# Patient Record
Sex: Female | Born: 1980 | Race: Black or African American | Hispanic: No | Marital: Single | Smoking: Former smoker
Health system: Southern US, Community
[De-identification: ages and names within clinical notes are randomized; demographics above are authoritative.]

## PROBLEM LIST (undated history)

## (undated) DIAGNOSIS — F32A Depression, unspecified: Secondary | ICD-10-CM

## (undated) DIAGNOSIS — Z8489 Family history of other specified conditions: Secondary | ICD-10-CM

## (undated) DIAGNOSIS — J45909 Unspecified asthma, uncomplicated: Secondary | ICD-10-CM

## (undated) DIAGNOSIS — R102 Pelvic and perineal pain: Secondary | ICD-10-CM

## (undated) DIAGNOSIS — R519 Headache, unspecified: Secondary | ICD-10-CM

## (undated) DIAGNOSIS — K219 Gastro-esophageal reflux disease without esophagitis: Secondary | ICD-10-CM

## (undated) DIAGNOSIS — F419 Anxiety disorder, unspecified: Secondary | ICD-10-CM

## (undated) DIAGNOSIS — F4310 Post-traumatic stress disorder, unspecified: Secondary | ICD-10-CM

## (undated) DIAGNOSIS — F410 Panic disorder [episodic paroxysmal anxiety] without agoraphobia: Secondary | ICD-10-CM

## (undated) HISTORY — PX: MYRINGOTOMY: SUR874

## (undated) HISTORY — PX: DILATION AND CURETTAGE OF UTERUS: SHX78

## (undated) HISTORY — PX: TONSILLECTOMY: SUR1361

## (undated) HISTORY — PX: ADENOIDECTOMY: SUR15

## (undated) HISTORY — PX: OTHER SURGICAL HISTORY: SHX169

---
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
## 2000-09-12 ENCOUNTER — Emergency Department (HOSPITAL_COMMUNITY): Admission: EM | Admit: 2000-09-12 | Discharge: 2000-09-12 | Payer: Self-pay | Admitting: Emergency Medicine

## 2001-03-05 ENCOUNTER — Emergency Department (HOSPITAL_COMMUNITY): Admission: EM | Admit: 2001-03-05 | Discharge: 2001-03-05 | Payer: Self-pay | Admitting: Emergency Medicine

## 2001-05-08 ENCOUNTER — Inpatient Hospital Stay (HOSPITAL_COMMUNITY): Admission: AD | Admit: 2001-05-08 | Discharge: 2001-05-08 | Payer: Self-pay | Admitting: Obstetrics

## 2001-06-07 ENCOUNTER — Other Ambulatory Visit: Admission: RE | Admit: 2001-06-07 | Discharge: 2001-06-07 | Payer: Self-pay | Admitting: *Deleted

## 2001-07-27 ENCOUNTER — Inpatient Hospital Stay (HOSPITAL_COMMUNITY): Admission: AD | Admit: 2001-07-27 | Discharge: 2001-07-27 | Payer: Self-pay | Admitting: *Deleted

## 2001-08-10 ENCOUNTER — Encounter: Payer: Self-pay | Admitting: *Deleted

## 2001-08-10 ENCOUNTER — Ambulatory Visit (HOSPITAL_COMMUNITY): Admission: RE | Admit: 2001-08-10 | Discharge: 2001-08-10 | Payer: Self-pay | Admitting: *Deleted

## 2001-09-14 ENCOUNTER — Encounter: Payer: Self-pay | Admitting: *Deleted

## 2001-09-14 ENCOUNTER — Ambulatory Visit (HOSPITAL_COMMUNITY): Admission: RE | Admit: 2001-09-14 | Discharge: 2001-09-14 | Payer: Self-pay | Admitting: *Deleted

## 2001-12-02 ENCOUNTER — Inpatient Hospital Stay (HOSPITAL_COMMUNITY): Admission: AD | Admit: 2001-12-02 | Discharge: 2001-12-02 | Payer: Self-pay | Admitting: *Deleted

## 2001-12-05 ENCOUNTER — Inpatient Hospital Stay (HOSPITAL_COMMUNITY): Admission: AD | Admit: 2001-12-05 | Discharge: 2001-12-05 | Payer: Self-pay | Admitting: *Deleted

## 2001-12-17 ENCOUNTER — Inpatient Hospital Stay (HOSPITAL_COMMUNITY): Admission: AD | Admit: 2001-12-17 | Discharge: 2001-12-17 | Payer: Self-pay | Admitting: *Deleted

## 2001-12-18 ENCOUNTER — Inpatient Hospital Stay (HOSPITAL_COMMUNITY): Admission: AD | Admit: 2001-12-18 | Discharge: 2001-12-20 | Payer: Self-pay | Admitting: *Deleted

## 2002-02-21 ENCOUNTER — Emergency Department (HOSPITAL_COMMUNITY): Admission: EM | Admit: 2002-02-21 | Discharge: 2002-02-21 | Payer: Self-pay | Admitting: Emergency Medicine

## 2002-02-21 ENCOUNTER — Encounter: Payer: Self-pay | Admitting: Emergency Medicine

## 2002-06-22 ENCOUNTER — Emergency Department (HOSPITAL_COMMUNITY): Admission: EM | Admit: 2002-06-22 | Discharge: 2002-06-22 | Payer: Self-pay | Admitting: Emergency Medicine

## 2002-06-26 ENCOUNTER — Emergency Department (HOSPITAL_COMMUNITY): Admission: EM | Admit: 2002-06-26 | Discharge: 2002-06-26 | Payer: Self-pay | Admitting: Emergency Medicine

## 2002-10-30 ENCOUNTER — Encounter: Admission: RE | Admit: 2002-10-30 | Discharge: 2002-10-30 | Payer: Self-pay | Admitting: Sports Medicine

## 2002-11-05 ENCOUNTER — Encounter: Admission: RE | Admit: 2002-11-05 | Discharge: 2002-11-05 | Payer: Self-pay | Admitting: Sports Medicine

## 2002-11-15 ENCOUNTER — Encounter: Admission: RE | Admit: 2002-11-15 | Discharge: 2002-11-15 | Payer: Self-pay | Admitting: Sports Medicine

## 2002-12-13 ENCOUNTER — Encounter: Admission: RE | Admit: 2002-12-13 | Discharge: 2002-12-13 | Payer: Self-pay | Admitting: Sports Medicine

## 2003-01-07 ENCOUNTER — Encounter: Admission: RE | Admit: 2003-01-07 | Discharge: 2003-01-07 | Payer: Self-pay | Admitting: Family Medicine

## 2003-10-20 ENCOUNTER — Emergency Department (HOSPITAL_COMMUNITY): Admission: EM | Admit: 2003-10-20 | Discharge: 2003-10-20 | Payer: Self-pay | Admitting: Emergency Medicine

## 2003-10-31 ENCOUNTER — Emergency Department (HOSPITAL_COMMUNITY): Admission: EM | Admit: 2003-10-31 | Discharge: 2003-10-31 | Payer: Self-pay | Admitting: *Deleted

## 2003-12-22 ENCOUNTER — Emergency Department (HOSPITAL_COMMUNITY): Admission: EM | Admit: 2003-12-22 | Discharge: 2003-12-23 | Payer: Self-pay | Admitting: Emergency Medicine

## 2004-05-12 ENCOUNTER — Ambulatory Visit: Payer: Self-pay | Admitting: Family Medicine

## 2004-05-21 ENCOUNTER — Ambulatory Visit: Payer: Self-pay | Admitting: Family Medicine

## 2004-06-10 ENCOUNTER — Ambulatory Visit: Payer: Self-pay | Admitting: Sports Medicine

## 2004-06-25 ENCOUNTER — Encounter (INDEPENDENT_AMBULATORY_CARE_PROVIDER_SITE_OTHER): Payer: Self-pay | Admitting: *Deleted

## 2004-06-25 LAB — CONVERTED CEMR LAB

## 2004-07-15 ENCOUNTER — Ambulatory Visit: Payer: Self-pay | Admitting: Sports Medicine

## 2004-09-13 ENCOUNTER — Emergency Department (HOSPITAL_COMMUNITY): Admission: EM | Admit: 2004-09-13 | Discharge: 2004-09-14 | Payer: Self-pay | Admitting: Emergency Medicine

## 2004-10-13 ENCOUNTER — Ambulatory Visit: Payer: Self-pay | Admitting: Sports Medicine

## 2004-11-11 ENCOUNTER — Ambulatory Visit: Payer: Self-pay | Admitting: Sports Medicine

## 2004-11-26 ENCOUNTER — Ambulatory Visit: Payer: Self-pay | Admitting: Family Medicine

## 2004-12-25 ENCOUNTER — Emergency Department (HOSPITAL_COMMUNITY): Admission: EM | Admit: 2004-12-25 | Discharge: 2004-12-25 | Payer: Self-pay | Admitting: Emergency Medicine

## 2004-12-28 ENCOUNTER — Ambulatory Visit: Payer: Self-pay | Admitting: Family Medicine

## 2005-01-18 ENCOUNTER — Encounter: Admission: RE | Admit: 2005-01-18 | Discharge: 2005-01-18 | Payer: Self-pay | Admitting: Otolaryngology

## 2005-01-19 ENCOUNTER — Ambulatory Visit: Payer: Self-pay | Admitting: Sports Medicine

## 2005-01-22 ENCOUNTER — Ambulatory Visit: Payer: Self-pay | Admitting: Sports Medicine

## 2005-01-27 ENCOUNTER — Ambulatory Visit: Payer: Self-pay | Admitting: Sports Medicine

## 2005-01-27 ENCOUNTER — Ambulatory Visit (HOSPITAL_COMMUNITY): Admission: RE | Admit: 2005-01-27 | Discharge: 2005-01-27 | Payer: Self-pay | Admitting: Sports Medicine

## 2005-02-03 ENCOUNTER — Other Ambulatory Visit: Admission: RE | Admit: 2005-02-03 | Discharge: 2005-02-03 | Payer: Self-pay | Admitting: Otolaryngology

## 2005-02-09 ENCOUNTER — Emergency Department (HOSPITAL_COMMUNITY): Admission: EM | Admit: 2005-02-09 | Discharge: 2005-02-10 | Payer: Self-pay | Admitting: Emergency Medicine

## 2005-02-18 ENCOUNTER — Ambulatory Visit: Payer: Self-pay | Admitting: Family Medicine

## 2005-02-23 ENCOUNTER — Ambulatory Visit: Payer: Self-pay | Admitting: Sports Medicine

## 2005-02-24 ENCOUNTER — Ambulatory Visit (HOSPITAL_COMMUNITY): Admission: RE | Admit: 2005-02-24 | Discharge: 2005-02-24 | Payer: Self-pay | Admitting: Sports Medicine

## 2005-03-18 ENCOUNTER — Ambulatory Visit: Payer: Self-pay | Admitting: *Deleted

## 2005-03-26 ENCOUNTER — Ambulatory Visit (HOSPITAL_COMMUNITY): Admission: RE | Admit: 2005-03-26 | Discharge: 2005-03-26 | Payer: Self-pay | Admitting: Otolaryngology

## 2005-03-26 ENCOUNTER — Encounter (INDEPENDENT_AMBULATORY_CARE_PROVIDER_SITE_OTHER): Payer: Self-pay | Admitting: *Deleted

## 2005-03-26 ENCOUNTER — Ambulatory Visit (HOSPITAL_BASED_OUTPATIENT_CLINIC_OR_DEPARTMENT_OTHER): Admission: RE | Admit: 2005-03-26 | Discharge: 2005-03-26 | Payer: Self-pay | Admitting: Otolaryngology

## 2005-03-30 ENCOUNTER — Ambulatory Visit: Payer: Self-pay | Admitting: Sports Medicine

## 2005-04-06 ENCOUNTER — Inpatient Hospital Stay (HOSPITAL_COMMUNITY): Admission: AD | Admit: 2005-04-06 | Discharge: 2005-04-06 | Payer: Self-pay | Admitting: *Deleted

## 2005-04-13 ENCOUNTER — Ambulatory Visit: Payer: Self-pay | Admitting: Sports Medicine

## 2005-04-23 ENCOUNTER — Ambulatory Visit: Payer: Self-pay | Admitting: Family Medicine

## 2005-04-28 ENCOUNTER — Emergency Department (HOSPITAL_COMMUNITY): Admission: EM | Admit: 2005-04-28 | Discharge: 2005-04-28 | Payer: Self-pay | Admitting: Family Medicine

## 2005-04-28 ENCOUNTER — Encounter: Admission: RE | Admit: 2005-04-28 | Discharge: 2005-04-28 | Payer: Self-pay | Admitting: Sports Medicine

## 2005-05-04 ENCOUNTER — Ambulatory Visit: Payer: Self-pay | Admitting: Sports Medicine

## 2005-05-20 ENCOUNTER — Ambulatory Visit: Payer: Self-pay | Admitting: Obstetrics and Gynecology

## 2005-05-26 ENCOUNTER — Encounter: Admission: RE | Admit: 2005-05-26 | Discharge: 2005-08-24 | Payer: Self-pay | Admitting: Orthopaedic Surgery

## 2005-06-16 ENCOUNTER — Emergency Department (HOSPITAL_COMMUNITY): Admission: EM | Admit: 2005-06-16 | Discharge: 2005-06-16 | Payer: Self-pay | Admitting: Family Medicine

## 2005-06-20 ENCOUNTER — Emergency Department (HOSPITAL_COMMUNITY): Admission: EM | Admit: 2005-06-20 | Discharge: 2005-06-20 | Payer: Self-pay | Admitting: Family Medicine

## 2005-07-01 ENCOUNTER — Ambulatory Visit: Payer: Self-pay | Admitting: Obstetrics and Gynecology

## 2005-07-02 ENCOUNTER — Ambulatory Visit (HOSPITAL_COMMUNITY): Admission: RE | Admit: 2005-07-02 | Discharge: 2005-07-02 | Payer: Self-pay | Admitting: Gastroenterology

## 2005-07-06 ENCOUNTER — Ambulatory Visit: Payer: Self-pay | Admitting: Family Medicine

## 2005-08-10 ENCOUNTER — Ambulatory Visit (HOSPITAL_COMMUNITY): Admission: RE | Admit: 2005-08-10 | Discharge: 2005-08-10 | Payer: Self-pay | Admitting: Gastroenterology

## 2005-11-07 IMAGING — CT CT ABDOMEN W/ CM
1 of 2 series · 15 of 32 positions shown, 19 images · IV contrast (&)
Comparison: none

03/03/05 ? DUPLICATE COPY for exam association in [facility] ? No change from original report.
CLINICAL DATA: Pelvic pain. 
 CT ABDOMEN WITH CONTRAST:
TECHNIQUE: Multidetector CT imaging of the abdomen was performed during bolus administration of intravenous contrast.  Oral contrast was given.  Delayed imaging through the kidneys was performed.
 Contrast:  100 cc Omnipaque 300.
TECHNIQUE: Multidetector CT imaging of the pelvis was performed during bolus administration of intravenous contrast.

[Series 2: routine abdomen · axial · 0.78mm/px · z∈[-447,-57]mm · 15 of 86 slices shown, 19 images]
[im 4/86  soft-tissue]
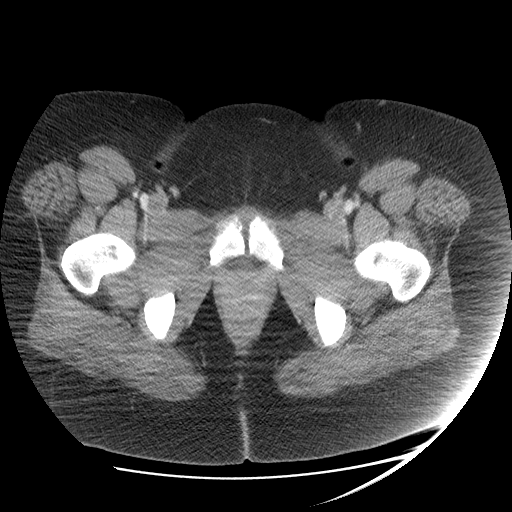
[im 4/86  bone]
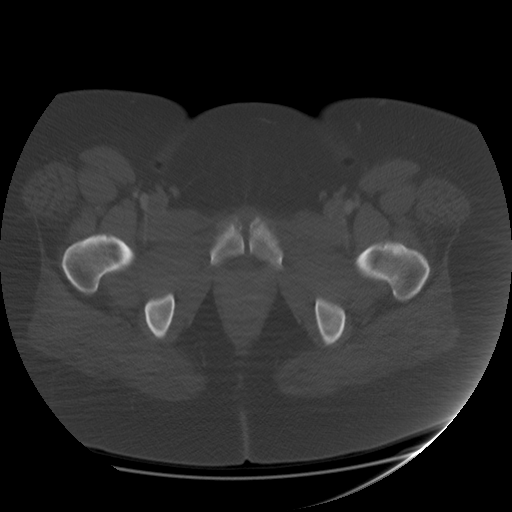
[im 12/86  soft-tissue]
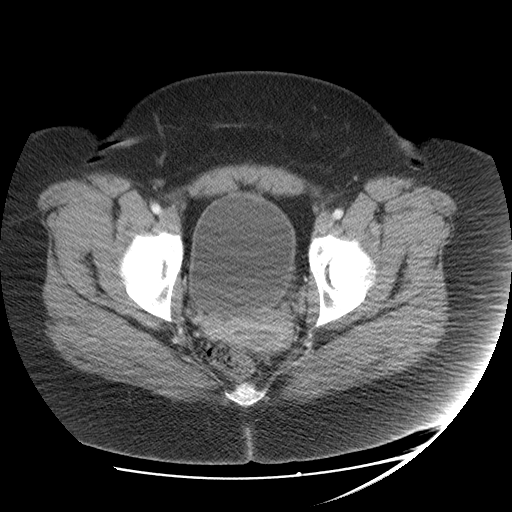
[im 19/86  soft-tissue]
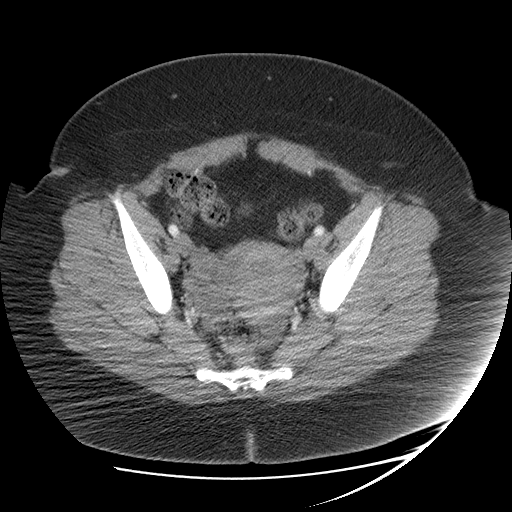
[im 23/86  soft-tissue]
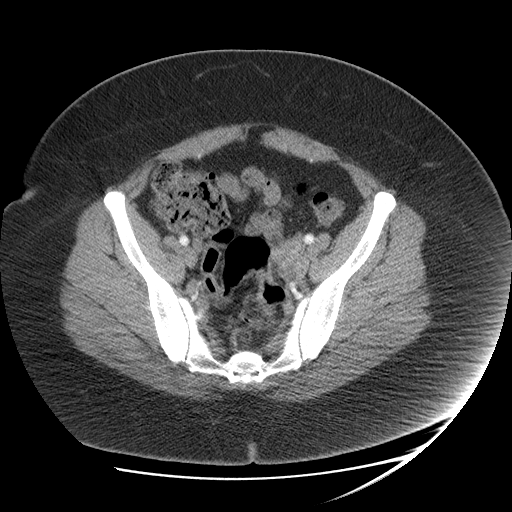
[im 30/86  soft-tissue]
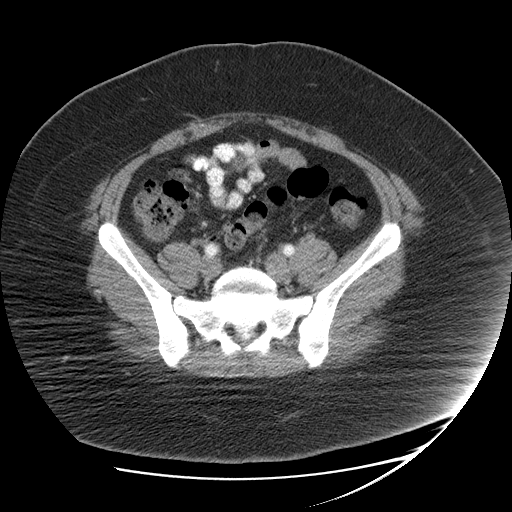
[im 37/86  soft-tissue]
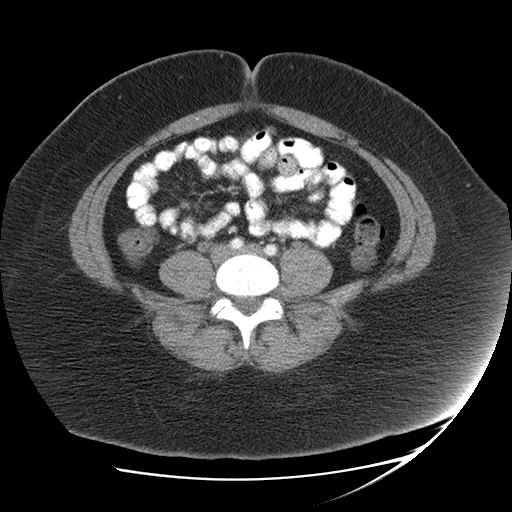
[im 45/86  soft-tissue]
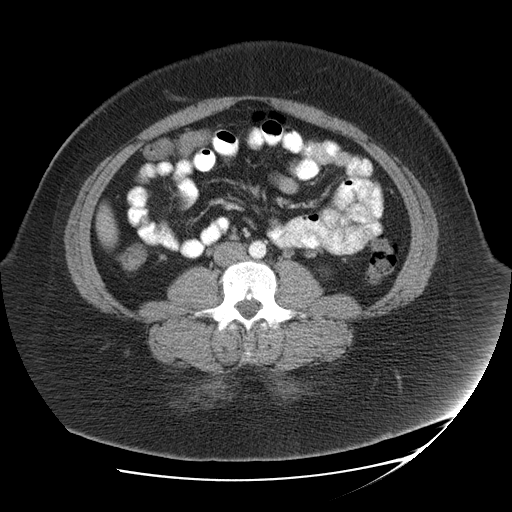
[im 49/86  soft-tissue]
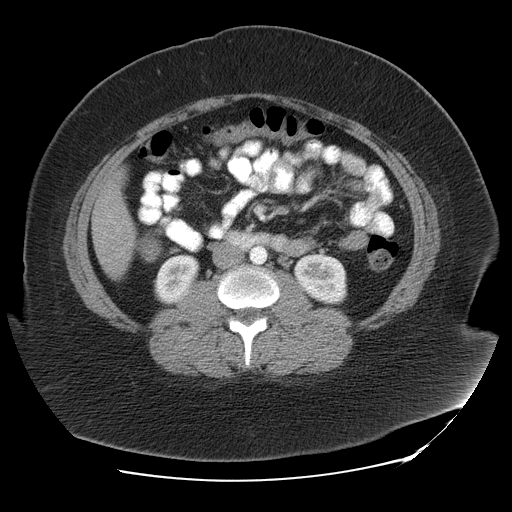
[im 56/86  soft-tissue]
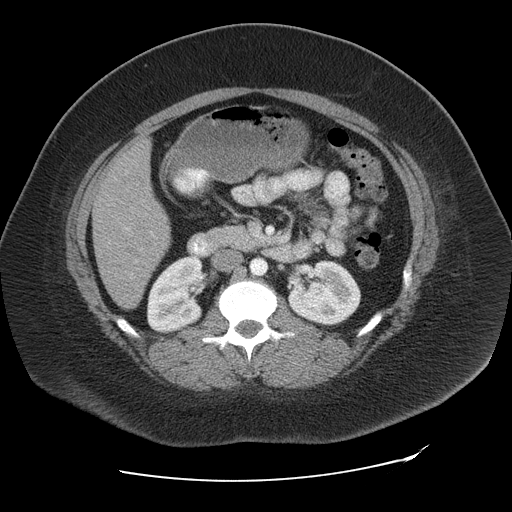
[im 56/86  bone]
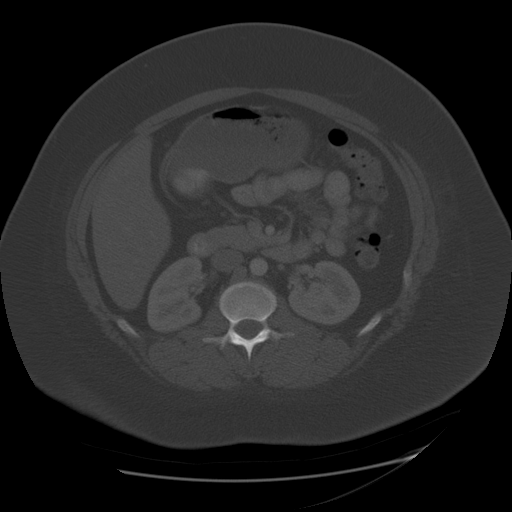
[im 63/86  soft-tissue]
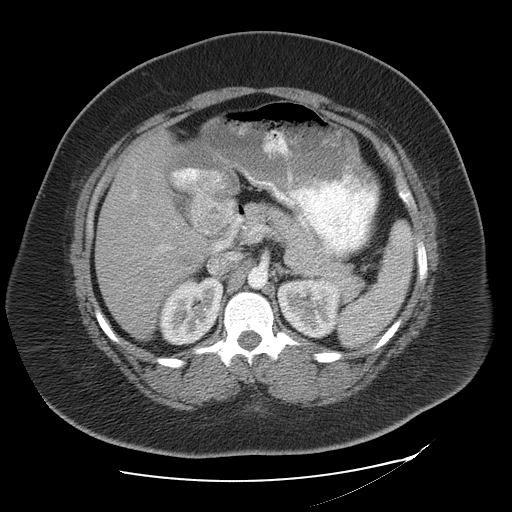
[im 67/86  soft-tissue]
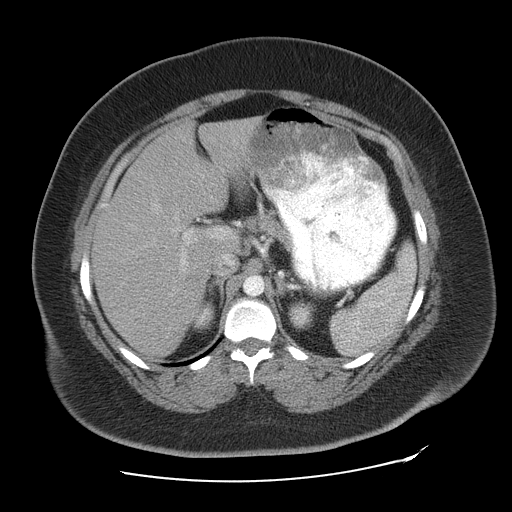
[im 71/86  lung]
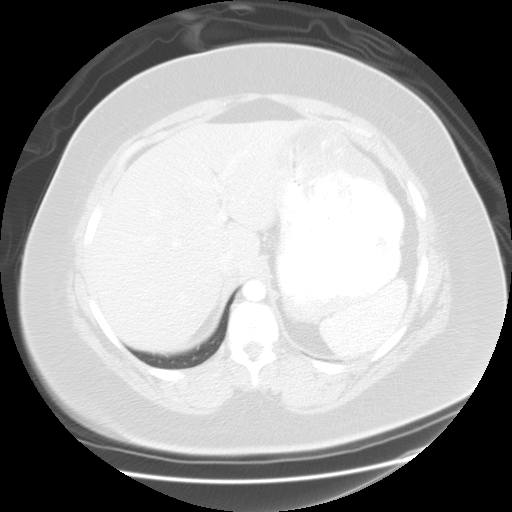
[im 74/86  soft-tissue]
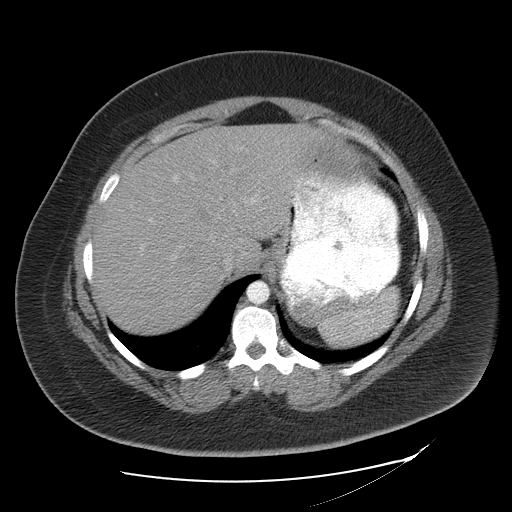
[im 74/86  lung]
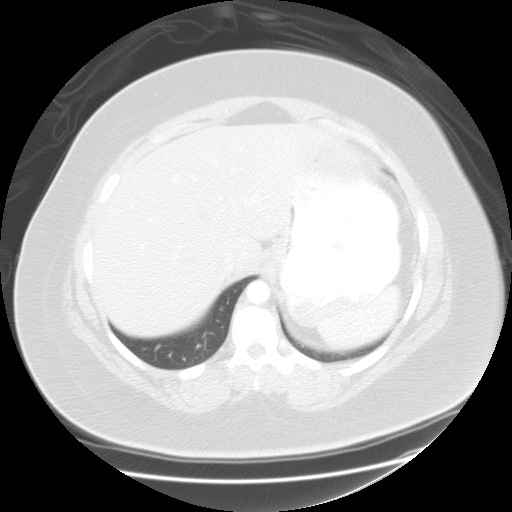
[im 78/86  lung]
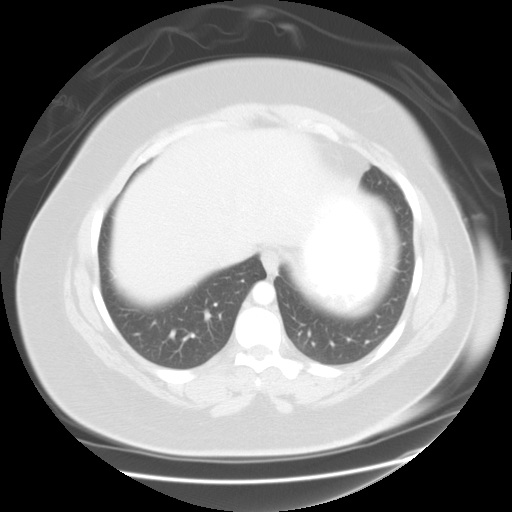
[im 82/86  soft-tissue]
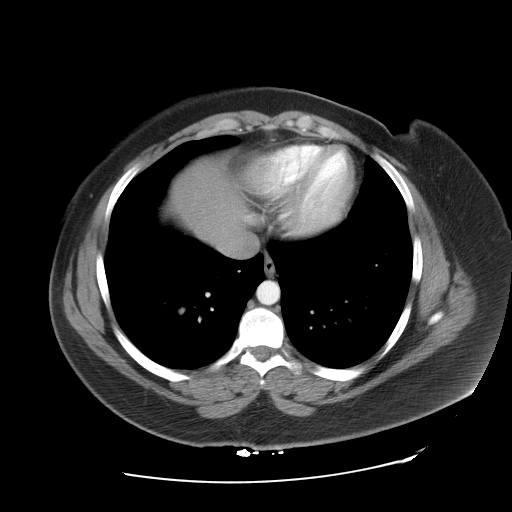
[im 82/86  lung]
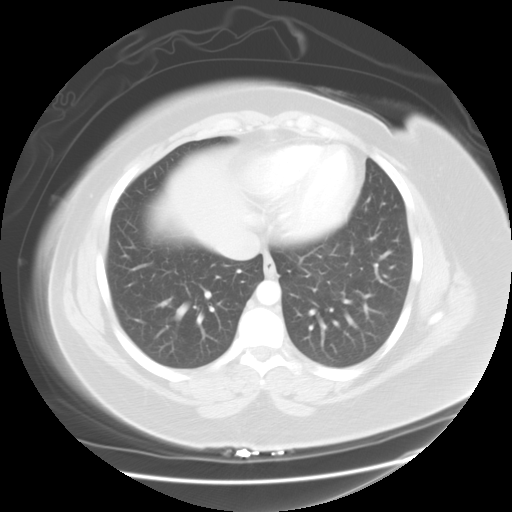

[15 of 32 positions shown; findings below may reference images not displayed]

FINDINGS: Mild diffuse fatty infiltration of the liver is present.  The gallbladder is decompressed. The spleen, pancreas, adrenal glands, and kidneys are within normal limits.  Negative free fluid.  Small mesenteric nodes are scattered in the abdomen.
IMPRESSION: No acute intraabdominal pathology. 
 CT PELVIS WITH CONTRAST:
FINDINGS: Lack of contrast in the colon limits exam of this structure.  No obvious colonic wall thickening is present. The appendix is difficult to visualize but to be within normal limits on images 61 through 55. An IUD is seen in the lower uterine aspect. The tip is at the level of the cervix.  There is no definite extension of the IUD outside of the myometrium. Cystic changes of the ovaries are present.  A trace amount of free fluid is present.  Negative abnormal adenopathy.
 Bilateral L5 pars defects are present with spondylolisthesis.
IMPRESSION: 1.  IUD device is positioned low in the uterus extending to the level of the cervix. 
 2.  Bilateral L5 pars defects.

## 2006-02-08 ENCOUNTER — Emergency Department (HOSPITAL_COMMUNITY): Admission: EM | Admit: 2006-02-08 | Discharge: 2006-02-08 | Payer: Self-pay | Admitting: Emergency Medicine

## 2006-04-07 ENCOUNTER — Emergency Department (HOSPITAL_COMMUNITY): Admission: EM | Admit: 2006-04-07 | Discharge: 2006-04-07 | Payer: Self-pay | Admitting: Family Medicine

## 2006-04-19 ENCOUNTER — Encounter: Admission: RE | Admit: 2006-04-19 | Discharge: 2006-04-19 | Payer: Self-pay | Admitting: Pediatrics

## 2006-06-23 ENCOUNTER — Inpatient Hospital Stay (HOSPITAL_COMMUNITY): Admission: AD | Admit: 2006-06-23 | Discharge: 2006-06-24 | Payer: Self-pay | Admitting: Obstetrics and Gynecology

## 2006-07-26 HISTORY — PX: OTHER SURGICAL HISTORY: SHX169

## 2006-09-22 DIAGNOSIS — E669 Obesity, unspecified: Secondary | ICD-10-CM

## 2006-09-22 DIAGNOSIS — M26609 Unspecified temporomandibular joint disorder, unspecified side: Secondary | ICD-10-CM | POA: Insufficient documentation

## 2006-09-22 DIAGNOSIS — K589 Irritable bowel syndrome without diarrhea: Secondary | ICD-10-CM | POA: Insufficient documentation

## 2006-09-22 DIAGNOSIS — J309 Allergic rhinitis, unspecified: Secondary | ICD-10-CM | POA: Insufficient documentation

## 2006-09-22 DIAGNOSIS — F339 Major depressive disorder, recurrent, unspecified: Secondary | ICD-10-CM | POA: Insufficient documentation

## 2006-09-22 DIAGNOSIS — E282 Polycystic ovarian syndrome: Secondary | ICD-10-CM

## 2006-09-23 ENCOUNTER — Encounter (INDEPENDENT_AMBULATORY_CARE_PROVIDER_SITE_OTHER): Payer: Self-pay | Admitting: *Deleted

## 2007-03-21 ENCOUNTER — Emergency Department (HOSPITAL_COMMUNITY): Admission: EM | Admit: 2007-03-21 | Discharge: 2007-03-21 | Payer: Self-pay | Admitting: Emergency Medicine

## 2007-03-29 ENCOUNTER — Ambulatory Visit (HOSPITAL_COMMUNITY): Admission: RE | Admit: 2007-03-29 | Discharge: 2007-03-29 | Payer: Self-pay | Admitting: *Deleted

## 2007-04-26 ENCOUNTER — Ambulatory Visit (HOSPITAL_COMMUNITY): Admission: RE | Admit: 2007-04-26 | Discharge: 2007-04-26 | Payer: Self-pay | Admitting: *Deleted

## 2007-05-15 ENCOUNTER — Emergency Department (HOSPITAL_COMMUNITY): Admission: EM | Admit: 2007-05-15 | Discharge: 2007-05-15 | Payer: Self-pay | Admitting: *Deleted

## 2007-06-08 ENCOUNTER — Inpatient Hospital Stay (HOSPITAL_COMMUNITY): Admission: AD | Admit: 2007-06-08 | Discharge: 2007-06-08 | Payer: Self-pay | Admitting: Obstetrics and Gynecology

## 2007-07-02 ENCOUNTER — Inpatient Hospital Stay (HOSPITAL_COMMUNITY): Admission: AD | Admit: 2007-07-02 | Discharge: 2007-07-02 | Payer: Self-pay | Admitting: Obstetrics & Gynecology

## 2007-07-15 ENCOUNTER — Inpatient Hospital Stay (HOSPITAL_COMMUNITY): Admission: AD | Admit: 2007-07-15 | Discharge: 2007-07-15 | Payer: Self-pay | Admitting: Obstetrics

## 2007-07-17 ENCOUNTER — Inpatient Hospital Stay (HOSPITAL_COMMUNITY): Admission: AD | Admit: 2007-07-17 | Discharge: 2007-07-18 | Payer: Self-pay | Admitting: *Deleted

## 2007-07-17 ENCOUNTER — Inpatient Hospital Stay (HOSPITAL_COMMUNITY): Admission: AD | Admit: 2007-07-17 | Discharge: 2007-07-17 | Payer: Self-pay | Admitting: Obstetrics and Gynecology

## 2007-07-18 ENCOUNTER — Inpatient Hospital Stay (HOSPITAL_COMMUNITY): Admission: AD | Admit: 2007-07-18 | Discharge: 2007-07-20 | Payer: Self-pay | Admitting: Obstetrics and Gynecology

## 2007-09-17 ENCOUNTER — Emergency Department (HOSPITAL_COMMUNITY): Admission: EM | Admit: 2007-09-17 | Discharge: 2007-09-17 | Payer: Self-pay | Admitting: Emergency Medicine

## 2008-03-09 ENCOUNTER — Emergency Department (HOSPITAL_COMMUNITY): Admission: EM | Admit: 2008-03-09 | Discharge: 2008-03-09 | Payer: Self-pay | Admitting: Family Medicine

## 2008-05-21 ENCOUNTER — Emergency Department (HOSPITAL_COMMUNITY): Admission: EM | Admit: 2008-05-21 | Discharge: 2008-05-21 | Payer: Self-pay | Admitting: *Deleted

## 2008-06-30 ENCOUNTER — Emergency Department (HOSPITAL_COMMUNITY): Admission: EM | Admit: 2008-06-30 | Discharge: 2008-06-30 | Payer: Self-pay | Admitting: Emergency Medicine

## 2008-09-17 ENCOUNTER — Emergency Department (HOSPITAL_COMMUNITY): Admission: EM | Admit: 2008-09-17 | Discharge: 2008-09-17 | Payer: Self-pay | Admitting: Emergency Medicine

## 2008-11-26 ENCOUNTER — Emergency Department (HOSPITAL_COMMUNITY): Admission: EM | Admit: 2008-11-26 | Discharge: 2008-11-26 | Payer: Self-pay | Admitting: Emergency Medicine

## 2009-01-02 ENCOUNTER — Emergency Department (HOSPITAL_COMMUNITY): Admission: EM | Admit: 2009-01-02 | Discharge: 2009-01-02 | Payer: Self-pay | Admitting: Emergency Medicine

## 2009-01-04 ENCOUNTER — Emergency Department (HOSPITAL_COMMUNITY): Admission: EM | Admit: 2009-01-04 | Discharge: 2009-01-04 | Payer: Self-pay | Admitting: Emergency Medicine

## 2009-01-10 ENCOUNTER — Ambulatory Visit (HOSPITAL_COMMUNITY): Admission: RE | Admit: 2009-01-10 | Discharge: 2009-01-10 | Payer: Self-pay | Admitting: Gastroenterology

## 2009-01-14 ENCOUNTER — Ambulatory Visit (HOSPITAL_COMMUNITY): Admission: RE | Admit: 2009-01-14 | Discharge: 2009-01-14 | Payer: Self-pay | Admitting: Gastroenterology

## 2009-02-17 ENCOUNTER — Ambulatory Visit (HOSPITAL_COMMUNITY): Admission: RE | Admit: 2009-02-17 | Discharge: 2009-02-17 | Payer: Self-pay | Admitting: Gastroenterology

## 2009-05-06 ENCOUNTER — Emergency Department (HOSPITAL_COMMUNITY): Admission: EM | Admit: 2009-05-06 | Discharge: 2009-05-07 | Payer: Self-pay | Admitting: Emergency Medicine

## 2009-05-24 ENCOUNTER — Emergency Department (HOSPITAL_COMMUNITY): Admission: EM | Admit: 2009-05-24 | Discharge: 2009-05-24 | Payer: Self-pay | Admitting: Emergency Medicine

## 2009-05-26 ENCOUNTER — Emergency Department (HOSPITAL_COMMUNITY): Admission: EM | Admit: 2009-05-26 | Discharge: 2009-05-26 | Payer: Self-pay | Admitting: Emergency Medicine

## 2009-08-26 ENCOUNTER — Emergency Department (HOSPITAL_COMMUNITY): Admission: EM | Admit: 2009-08-26 | Discharge: 2009-08-26 | Payer: Self-pay | Admitting: Emergency Medicine

## 2009-10-30 ENCOUNTER — Emergency Department (HOSPITAL_COMMUNITY): Admission: EM | Admit: 2009-10-30 | Discharge: 2009-10-31 | Payer: Self-pay | Admitting: Emergency Medicine

## 2010-01-11 ENCOUNTER — Emergency Department (HOSPITAL_COMMUNITY): Admission: EM | Admit: 2010-01-11 | Discharge: 2010-01-12 | Payer: Self-pay | Admitting: Emergency Medicine

## 2010-02-13 ENCOUNTER — Inpatient Hospital Stay (HOSPITAL_COMMUNITY): Admission: AD | Admit: 2010-02-13 | Discharge: 2010-02-13 | Payer: Self-pay | Admitting: Family Medicine

## 2010-02-13 ENCOUNTER — Ambulatory Visit: Payer: Self-pay | Admitting: Nurse Practitioner

## 2010-02-13 ENCOUNTER — Encounter: Payer: Self-pay | Admitting: Family Medicine

## 2010-02-15 ENCOUNTER — Ambulatory Visit (HOSPITAL_COMMUNITY): Admission: AD | Admit: 2010-02-15 | Discharge: 2010-02-15 | Payer: Self-pay | Admitting: Obstetrics & Gynecology

## 2010-02-17 ENCOUNTER — Ambulatory Visit (HOSPITAL_COMMUNITY): Admission: RE | Admit: 2010-02-17 | Discharge: 2010-02-17 | Payer: Self-pay | Admitting: Obstetrics and Gynecology

## 2010-02-24 ENCOUNTER — Ambulatory Visit: Payer: Self-pay | Admitting: Obstetrics and Gynecology

## 2010-02-24 ENCOUNTER — Inpatient Hospital Stay (HOSPITAL_COMMUNITY)
Admission: RE | Admit: 2010-02-24 | Discharge: 2010-02-24 | Payer: Self-pay | Source: Home / Self Care | Admitting: Obstetrics and Gynecology

## 2010-06-14 ENCOUNTER — Inpatient Hospital Stay (HOSPITAL_COMMUNITY)
Admission: AD | Admit: 2010-06-14 | Discharge: 2010-06-14 | Payer: Self-pay | Source: Home / Self Care | Admitting: Obstetrics & Gynecology

## 2010-06-25 ENCOUNTER — Inpatient Hospital Stay (HOSPITAL_COMMUNITY): Admission: AD | Admit: 2010-06-25 | Discharge: 2010-06-25 | Payer: Self-pay | Admitting: Obstetrics

## 2010-08-17 ENCOUNTER — Encounter: Payer: Self-pay | Admitting: Gastroenterology

## 2010-08-26 ENCOUNTER — Inpatient Hospital Stay (HOSPITAL_COMMUNITY)
Admission: AD | Admit: 2010-08-26 | Discharge: 2010-08-27 | Disposition: A | Discharge status: Home / Self Care | Payer: Medicaid Other | Source: Ambulatory Visit | Attending: Obstetrics & Gynecology | Admitting: Obstetrics & Gynecology

## 2010-08-26 DIAGNOSIS — O99891 Other specified diseases and conditions complicating pregnancy: Secondary | ICD-10-CM | POA: Insufficient documentation

## 2010-08-26 DIAGNOSIS — O9989 Other specified diseases and conditions complicating pregnancy, childbirth and the puerperium: Secondary | ICD-10-CM

## 2010-08-26 DIAGNOSIS — J45909 Unspecified asthma, uncomplicated: Secondary | ICD-10-CM | POA: Insufficient documentation

## 2010-10-05 LAB — URINE CULTURE: Culture  Setup Time: 201112020126

## 2010-10-05 LAB — URINALYSIS, ROUTINE W REFLEX MICROSCOPIC
Bilirubin Urine: NEGATIVE
Nitrite: NEGATIVE
Specific Gravity, Urine: 1.01 (ref 1.005–1.030)
Urobilinogen, UA: 0.2 mg/dL (ref 0.0–1.0)
pH: 6.5 (ref 5.0–8.0)

## 2010-10-05 LAB — GC/CHLAMYDIA PROBE AMP, GENITAL
Chlamydia, DNA Probe: NEGATIVE
GC Probe Amp, Genital: NEGATIVE

## 2010-10-05 LAB — WET PREP, GENITAL
Clue Cells Wet Prep HPF POC: NONE SEEN
Trich, Wet Prep: NONE SEEN

## 2010-10-05 LAB — URINE MICROSCOPIC-ADD ON

## 2010-10-06 LAB — COMPREHENSIVE METABOLIC PANEL
AST: 16 U/L (ref 0–37)
BUN: 4 mg/dL — ABNORMAL LOW (ref 6–23)
CO2: 26 mEq/L (ref 19–32)
Chloride: 108 mEq/L (ref 96–112)
Creatinine, Ser: 0.63 mg/dL (ref 0.4–1.2)
GFR calc non Af Amer: >60 mL/min (ref >60)
Glucose, Bld: 93 mg/dL (ref 70–99)
Total Bilirubin: 0.8 mg/dL (ref 0.3–1.2)

## 2010-10-06 LAB — CBC
HCT: 32.2 % — ABNORMAL LOW (ref 36.0–46.0)
Hemoglobin: 11 g/dL — ABNORMAL LOW (ref 12.0–15.0)
MCH: 31.9 pg (ref 26.0–34.0)
MCHC: 34.2 g/dL (ref 30.0–36.0)
MCV: 93.3 fL (ref 78.0–100.0)
Platelets: 196 K/uL (ref 150–400)
RBC: 3.45 MIL/uL — ABNORMAL LOW (ref 3.87–5.11)
RDW: 13.1 % (ref 11.5–15.5)
WBC: 10.2 K/uL (ref 4.0–10.5)

## 2010-10-06 LAB — URINALYSIS, ROUTINE W REFLEX MICROSCOPIC
Bilirubin Urine: NEGATIVE
Glucose, UA: NEGATIVE mg/dL
Hgb urine dipstick: NEGATIVE
Ketones, ur: NEGATIVE mg/dL
Nitrite: NEGATIVE
Protein, ur: NEGATIVE mg/dL
Specific Gravity, Urine: 1.01 (ref 1.005–1.030)
Urobilinogen, UA: 0.2 mg/dL (ref 0.0–1.0)
pH: 7.5 (ref 5.0–8.0)

## 2010-10-06 LAB — URINE MICROSCOPIC-ADD ON

## 2010-10-06 LAB — URINE CULTURE
Colony Count: NO GROWTH
Culture  Setup Time: 201111201924
Culture: NO GROWTH

## 2010-10-09 ENCOUNTER — Inpatient Hospital Stay (HOSPITAL_COMMUNITY)
Admission: RE | Admit: 2010-10-09 | Discharge: 2010-10-12 | DRG: 775 | Disposition: A | Discharge status: Home / Self Care | Payer: Medicaid Other | Source: Ambulatory Visit | Attending: Obstetrics & Gynecology | Admitting: Obstetrics & Gynecology

## 2010-10-09 DIAGNOSIS — O99892 Other specified diseases and conditions complicating childbirth: Secondary | ICD-10-CM | POA: Diagnosis present

## 2010-10-09 DIAGNOSIS — O34219 Maternal care for unspecified type scar from previous cesarean delivery: Secondary | ICD-10-CM | POA: Diagnosis present

## 2010-10-09 DIAGNOSIS — O365990 Maternal care for other known or suspected poor fetal growth, unspecified trimester, not applicable or unspecified: Principal | ICD-10-CM | POA: Diagnosis present

## 2010-10-09 DIAGNOSIS — Z22330 Carrier of Group B streptococcus: Secondary | ICD-10-CM

## 2010-10-09 LAB — CBC
Hemoglobin: 11.4 g/dL — ABNORMAL LOW (ref 12.0–15.0)
MCHC: 32.9 g/dL (ref 30.0–36.0)
RBC: 3.92 MIL/uL (ref 3.87–5.11)
WBC: 12 10*3/uL — ABNORMAL HIGH (ref 4.0–10.5)

## 2010-10-09 LAB — RPR: RPR Ser Ql: NONREACTIVE

## 2010-10-10 LAB — CBC
MCH: 31.1 pg (ref 26.0–34.0)
MCHC: 33.7 g/dL (ref 30.0–36.0)
Platelets: 217 10*3/uL (ref 150–400)

## 2010-10-10 LAB — URINALYSIS, ROUTINE W REFLEX MICROSCOPIC
Protein, ur: NEGATIVE mg/dL
Urobilinogen, UA: 0.2 mg/dL (ref 0.0–1.0)
pH: 6 (ref 5.0–8.0)

## 2010-10-10 LAB — URINE CULTURE: Colony Count: NO GROWTH

## 2010-10-10 LAB — URINE MICROSCOPIC-ADD ON

## 2010-10-10 LAB — WET PREP, GENITAL

## 2010-10-10 LAB — POCT PREGNANCY, URINE: Preg Test, Ur: POSITIVE

## 2010-10-10 LAB — HCG, QUANTITATIVE, PREGNANCY: hCG, Beta Chain, Quant, S: 1006 m[IU]/mL — ABNORMAL HIGH (ref <5)

## 2010-10-11 LAB — CBC
HCT: 35.8 % — ABNORMAL LOW (ref 36.0–46.0)
MCHC: 31.3 g/dL (ref 30.0–36.0)
MCV: 90.6 fL (ref 78.0–100.0)
RDW: 13.1 % (ref 11.5–15.5)

## 2010-10-12 ENCOUNTER — Other Ambulatory Visit: Payer: Self-pay | Admitting: Obstetrics & Gynecology

## 2010-10-14 LAB — COMPREHENSIVE METABOLIC PANEL
ALT: 13 U/L (ref 0–35)
AST: 15 U/L (ref 0–37)
Alkaline Phosphatase: 37 U/L — ABNORMAL LOW (ref 39–117)
CO2: 27 mEq/L (ref 19–32)
Chloride: 106 mEq/L (ref 96–112)
Creatinine, Ser: 0.91 mg/dL (ref 0.4–1.2)
GFR calc Af Amer: >60 mL/min (ref >60)
GFR calc non Af Amer: >60 mL/min (ref >60)
Potassium: 3.9 mEq/L (ref 3.5–5.1)
Total Bilirubin: 0.8 mg/dL (ref 0.3–1.2)

## 2010-10-14 LAB — URINALYSIS, ROUTINE W REFLEX MICROSCOPIC
Bilirubin Urine: NEGATIVE
Protein, ur: NEGATIVE mg/dL
Specific Gravity, Urine: 1.034 — ABNORMAL HIGH (ref 1.005–1.030)
Urobilinogen, UA: 1 mg/dL (ref 0.0–1.0)

## 2010-10-14 LAB — CBC
MCV: 92.3 fL (ref 78.0–100.0)
RBC: 4.4 MIL/uL (ref 3.87–5.11)
WBC: 8.9 10*3/uL (ref 4.0–10.5)

## 2010-10-14 LAB — URINE MICROSCOPIC-ADD ON

## 2010-10-14 LAB — DIFFERENTIAL
Basophils Absolute: 0 10*3/uL (ref 0.0–0.1)
Basophils Relative: 1 % (ref 0–1)
Eosinophils Absolute: 0.1 10*3/uL (ref 0.0–0.7)
Eosinophils Relative: 1 % (ref 0–5)
Lymphocytes Relative: 30 % (ref 12–46)
Monocytes Absolute: 0.6 10*3/uL (ref 0.1–1.0)

## 2010-10-14 LAB — WET PREP, GENITAL

## 2010-10-14 LAB — POCT PREGNANCY, URINE: Preg Test, Ur: NEGATIVE

## 2010-10-14 NOTE — H&P (Signed)
[name], [name]          ACCOUNT NO.:  [id number]  MEDICAL RECORD NO.:  [id number]           PATIENT TYPE:  I  LOCATION:  9170                          [facility]  PHYSICIAN:  Roseanna Rainbow, M.D.DATE OF BIRTH:  1980-12-17  DATE OF ADMISSION:  10/09/2010 DATE OF DISCHARGE:                             HISTORY & PHYSICAL   CHIEF COMPLAINT:  The patient is a 30 year old para 3 with an estimated date of confinement of October 16, 2010, with a pregnancy complicated by intrauterine growth restriction, history of a previous cesarean delivery with 2 successful VBAC attempts, for induction of labor.  HISTORY OF PRESENT ILLNESS:  On the most recent ultrasound for growth, the estimated fetal weight was at the 7th percentile.  The amniotic fluid index was normal.  Antenatal testing has been reassuring.  The patient has a previous history of a small for gestational age infant.  ALLERGIES:  PENICILLIN, BETADINE, MONISTAT, and NEOSPORIN.  MEDICATIONS:  Please see the medication reconciliation form.  OB RISK FACTORS:  Please see the above.  GBS positive.  PRENATAL LABS:  Chlamydia probe negative.  Urine culture and sensitivity GBS 2000 colonies/mL.  Pap smear negative.  GC probe negative.  Two hour GCT negative.  Hepatitis B surface antigen negative.  Hematocrit 35.7. Hemoglobin 11.6.  HIV nonreactive.  Platelets 227,000.  Blood type is O positive.  Antibody screen negative.  RPR nonreactive.  Rubella immune. Sickle cell negative.  Quad screen negative.  PAST OB HISTORY:  In July 1999, she was delivered postdate 7 pounds 14 ounce female via cesarean delivery.  In May 2003, she was delivered postdate 6 gram 11 ounce female via vaginal delivery.  In December 2008, she was delivered at term 5 pound female vaginal delivery.  PAST GYN HISTORY:  There is a history of endometriosis.  There is a history of abnormal Pap smear.  PAST MEDICAL HISTORY:  Asthma.  PAST SURGICAL  HISTORY:  Please see the above.  SOCIAL HISTORY:    She is single and does not give any significant history of alcohol use; she reports current tobacco use.  FAMILY HISTORY:  Remarkable for adult-onset diabetes, hypertension and hyperthyroidism.  REVIEW OF SYSTEMS:  Noncontributory.  PHYSICAL EXAMINATION:  VITAL SIGNS:  Stable, afebrile.  Fetal heart tracing baseline 140 beats per minute.  Moderate long-term variability. Tocodynamometer via uterine contractions. GENERAL:  Obese female, in no apparent distress. ABDOMEN:  Gravid. PELVIC:  A speculum was placed in the patient's vagina.  The vagina was cleansed with Hibiclens and the area of the cervix was cleansed with Hibiclens.  A sponge stick was applied to the anterior lip  of the cervix for retraction.  The balloon  was then placed to the cervix into the lower uterine segment.  The uterine balloon was then inflated.  The speculum was then removed, the vaginal balloon was then inflated.  ASSESSMENT:  Multipara at term, with a pregnancy complicated by intrauterine growth restriction, history of  aprevious cesarean delivery, status post 2 successful vaginal birth after cesarean delivery attempts.  GBS positive. Category one fetal heart tracing, borderline unfavorable Bishop score.  PLAN:  Admission.  Ancef  for GBS prophylaxis.  Mechanical ripening to be followed by Pitocin/AROM.     Roseanna Rainbow, M.D.     Judee Clara  D:  10/09/2010  T:  10/09/2010  Job:  [id number]  Electronically Signed by Antionette Char M.D. on 10/14/2010 09:58:40 PM

## 2010-11-02 LAB — COMPREHENSIVE METABOLIC PANEL
ALT: 11 U/L (ref 0–35)
Albumin: 3.6 g/dL (ref 3.5–5.2)
Alkaline Phosphatase: 38 U/L — ABNORMAL LOW (ref 39–117)
BUN: 10 mg/dL (ref 6–23)
Chloride: 107 mEq/L (ref 96–112)
Glucose, Bld: 88 mg/dL (ref 70–99)
Potassium: 3.9 mEq/L (ref 3.5–5.1)
Sodium: 140 mEq/L (ref 135–145)
Total Bilirubin: 0.6 mg/dL (ref 0.3–1.2)

## 2010-11-02 LAB — CBC
HCT: 36.2 % (ref 36.0–46.0)
Hemoglobin: 12.2 g/dL (ref 12.0–15.0)
WBC: 6.9 10*3/uL (ref 4.0–10.5)

## 2010-11-02 LAB — DIFFERENTIAL
Basophils Absolute: 0 10*3/uL (ref 0.0–0.1)
Basophils Relative: 0 % (ref 0–1)
Eosinophils Absolute: 0.1 10*3/uL (ref 0.0–0.7)
Monocytes Absolute: 0.3 10*3/uL (ref 0.1–1.0)
Monocytes Relative: 4 % (ref 3–12)
Neutro Abs: 4.5 10*3/uL (ref 1.7–7.7)
Neutrophils Relative %: 66 % (ref 43–77)

## 2010-11-02 LAB — POCT I-STAT, CHEM 8
BUN: 12 mg/dL (ref 6–23)
Calcium, Ion: 1.06 mmol/L — ABNORMAL LOW (ref 1.12–1.32)
Chloride: 104 meq/L (ref 96–112)
Creatinine, Ser: 1.1 mg/dL (ref 0.4–1.2)
Glucose, Bld: 70 mg/dL (ref 70–99)
HCT: 41 % (ref 36.0–46.0)
Hemoglobin: 13.9 g/dL (ref 12.0–15.0)
Potassium: 5 meq/L (ref 3.5–5.1)
Sodium: 137 mEq/L (ref 135–145)
TCO2: 25 mmol/L (ref 0–100)

## 2010-11-02 LAB — URINALYSIS, ROUTINE W REFLEX MICROSCOPIC
Glucose, UA: NEGATIVE mg/dL
Hgb urine dipstick: NEGATIVE
Ketones, ur: NEGATIVE mg/dL
Protein, ur: NEGATIVE mg/dL
Urobilinogen, UA: 0.2 mg/dL (ref 0.0–1.0)

## 2010-11-02 LAB — CLOTEST (H. PYLORI), BIOPSY: Helicobacter screen: NEGATIVE

## 2010-12-08 NOTE — Op Note (Signed)
NAMEMELANEY, [name]          ACCOUNT NO.:  [id number]   MEDICAL RECORD NO.:  [id number]          PATIENT TYPE:  AMB   LOCATION:  SDS                          FACILITY:  [facility]   PHYSICIAN:  James L. Malon Kindle., M.D.DATE OF BIRTH:  [birth year]   DATE OF PROCEDURE:  01/10/2009  DATE OF DISCHARGE:  01/10/2009                               OPERATIVE REPORT   SURGEON:  Fayrene Fearing L. Randa Evens, MD   PROCEDURE:  Esophagogastroduodenoscopy.   MEDICATIONS:  Cetacaine spray, fentanyl 100 mcg, Versed 10 mg IV.   INDICATIONS:  Abdominal pain.  The patient had similar pain in 2006 and  2007 with a fairly extensive workup at that time and this was all  negative.  Apparently, she did well for awhile but then presented to the  emergency room approximately a week ago with severe epigastric pain.  She had no other significant symptoms, it was worsened by eating.  Her  workup at the emergency room revealed normal hemoglobin and white count,  normal liver function tests, normal pregnancy test.  She was given  Protonix and Dilaudid and sent back to Dr. Bosie Clos who had evaluated  her approximately 3 years ago with CT ultrasound and endoscopy.  She  currently has continued to have pain despite the use of proton pump  inhibitors and Carafate.   DESCRIPTION OF PROCEDURE:  Procedure explained to the patient and  consent obtained.  In left lateral decubitus position, the Pentax upper  scope, adult scope, inserted blindly with agglutination and advanced  into the esophagus.  The esophagus was completely normal with no signs  of esophagitis, fungal infection, etc.  We passed into the stomach and  the gastric outlet identified.  It did appear to me somewhat stenotic,  but the scope did easily passed.  A complete evaluation of the duodenum  was performed.  There were no ulceration or inflammation.  The bulb and  second portion were both normal.  The antrum body, fundus, and cardia of  the stomach were all  examined in the forward and retroflexed view and  appeared normal.  A biopsy was taken for rapid urease test for  Helicobacter.  The scope was withdrawn, and the patient tolerated the  procedure well.   ASSESSMENT:  Epigastric pain with no obvious cause detected on  endoscopy.   PLAN:  We will go ahead and arrange a gallbladder ultrasound as an  outpatient and have her follow with Dr. Bosie Clos.  We will keep her on  the same medications for now.           ______________________________  Llana Aliment. Malon Kindle., M.D.     Waldron Session  D:  01/10/2009  T:  01/11/2009  Job:  [id number]   cc:   Shirley Friar, MD

## 2010-12-11 NOTE — Op Note (Signed)
[name], [name]          ACCOUNT NO.:  [id number]   MEDICAL RECORD NO.:  [id number]          PATIENT TYPE:  AMB   LOCATION:  ENDO                         FACILITY:  [facility]   PHYSICIAN:  Shirley Friar, MDDATE OF BIRTH:  [birth year]   DATE OF PROCEDURE:  08/10/2005  DATE OF DISCHARGE:                                 OPERATIVE REPORT   PROCEDURE:  Endoscopy.   INDICATIONS:  Abdominal pain and abnormal upper GI series.   MEDICATIONS:  Fentanyl 50 mcg IV, Versed 7.5 mg IV.   FINDINGS:  Endoscope was inserted into the oropharynx and the esophagus was  intubated which was normal in its entirety.  The GE junction was noted at  approximately 38 cm from the incisors.  The endoscope was advanced into the  stomach which was normal including normal body, antrum, pylorus, angularis,  cardia and fundus.  The endoscope was straightened and advanced down into  the duodenal bulb which was normal in appearance.  No ulcers or mucosal  abnormalities were seen in the duodenal bulb and the second portion of the  duodenum was normal as well.  The endoscope was withdrawn back into the  stomach and biopsies were taken for CLOtest.  Endoscope was then withdrawn  and confirmed the above findings.   ASSESSMENT:  Normal upper endoscopy - status post CLOtest secondary to  chronic abdominal pain.   PLAN:  1.  Proceed with colonoscopy.  2.  Follow up on CLOtest and treat if positive for H. pylori.      Shirley Friar, MD  Electronically Signed     VCS/MEDQ  D:  08/10/2005  T:  08/10/2005  Job:  [id number]   cc:   Melina Fiddler, MD  Fax: [phone]

## 2010-12-11 NOTE — Group Therapy Note (Signed)
NAMEROSEY, EIDE NO.:  [id number]   MEDICAL RECORD NO.:  [id number]          PATIENT TYPE:  WOC   LOCATION:  [facility]                   FACILITY:  [facility]   PHYSICIAN:  Tinnie Gens, MD        DATE OF BIRTH:  02/17/1981   DATE OF SERVICE:  03/18/2005                                    CLINIC NOTE   HISTORY OF PRESENT ILLNESS:  The patient is a 30 year old G2 P2-0-0-2 who  presents with a chief complaint of lower abdominal pain and pelvic pain. The  patient reports she was in her normal state of health until 2-3 months ago  when she developed this pelvic pain. It is more significant on the right  than on the left. This past weekend from Thursday to Sunday she rated it at  a 10/10. Reported that it was better with Vicodin  and worse with walking or  driving over bumps. She has been seen multiple times by her primary care  physician for this problem. She had an ultrasound in January 27, 2005 that  showed a normal uterus with a right hemorrhagic cyst measuring 5.3 x 4.2 x  3.1 and it also noted that her IUD was in the lower uterine segment and the  cervix. On a repeat ultrasound on February 09, 2005 it showed an 18 x 16 x 14 mm  lobulated hypoechoic lesion consistent with a __________ hemorrhagic cyst  and the IUD was still low in position. It appeared to extend to the  endocervical os. She also has had a CT scan that was essentially normal  except she had an L5 pars defect. The patient has had a normal Pap smear and  STD screen in December 2005.   PAST MEDICAL HISTORY:  Irritable bowel and depression.   PAST SURGICAL HISTORY:  Cesarean section.   MEDICATIONS:  Lexapro and Bentyl.   REVIEW OF SYSTEMS:  Full 14-point review of systems negative except for she  does have fluid behind her right ear that she is going to be having surgery  on. Denies any fever or chills, night sweats. Denies any current problems  with nausea, vomiting, diarrhea, constipation.   PHYSICAL  EXAMINATION:  VITAL SIGNS:  Temperature 97.5, blood pressure  108/76, pulse 76, weight 260.1.  GENERAL:  Well-developed, well-nourished female in no apparent distress. Her  mother is accompanying her today.  CARDIOVASCULAR:  Regular rate and rhythm.  LUNGS:  Clear to auscultation bilaterally.  ABDOMEN:  Obese, mild tenderness to palpation in the left lower quadrant.  Positive bowel sounds.  GENITOURINARY:  Normal external genitalia. Speculum exam reveals that she  does have a black string coming from her cervical os consistent with an IUD.  On bimanual she has some tenderness to palpation on the right adnexa but it  does not feel enlarged.   IMPRESSION:  1.  Pelvic pain.  2.  Abdominal pain.   PLAN:  She is discussed with Dr. Shawnie Pons. I explained to the patient that  although she has an IUD she appears to still be ovulating, as indicated by  her having the hemorrhagic cyst. We will treat her  with a trial of oral  contraceptives to see if that will suppress her ovulation. She is to take  one tablet daily and skip the placebo pill. She is to try this for 2-3  months and if the pain persists we will consider doing an exploratory  laparoscopy. The patient was also encouraged to take Tylenol daily. We also  discussed her taking an NSAID. However, her Lexapro can increase her risk of  bleeding. If the Tylenol does not work she is to try a low dose of  ibuprofen. If she has any apparent side effects, she is to discharge it.  Follow up in 2-3 months.     ______________________________  Carolanne Grumbling, M.D.    ______________________________  Tinnie Gens, MD    TW/MEDQ  D:  03/18/2005  T:  03/19/2005  Job:  [id number]

## 2010-12-11 NOTE — Op Note (Signed)
NAMEANJELIQUE, MAKAR          ACCOUNT NO.:  [id number]   MEDICAL RECORD NO.:  [id number]          PATIENT TYPE:  AMB   LOCATION:  ENDO                         FACILITY:  [facility]   PHYSICIAN:  Shirley Friar, MDDATE OF BIRTH:  Dec 31, 1980   DATE OF PROCEDURE:  08/10/2005  DATE OF DISCHARGE:                                 OPERATIVE REPORT   PROCEDURE:  Colonoscopy.   INDICATIONS FOR PROCEDURE:  Rectal bleeding and abdominal pain.   MEDICATIONS:  Fentanyl 45 mcg IV, Versed 5.5 mg IV.   FINDINGS:  Rectal exam was normal.  A standard adult colonoscope was  inserted and advanced through a well prepped colon to the cecum where the  ileocecal valve and appendiceal orifice were identified.  On careful  withdrawal revealed no mucosal abnormalities and normal appearing colonic  mucosa.  Retroflexion showed small internal hemorrhoids.  The colonoscope  was withdrawn and confirmed the above findings.   ASSESSMENT:  Internal hemorrhoids, otherwise normal colonoscopy.   PLAN:  1.  High fiber diet.  2.  Continue antispasmodics for irritable bowel syndrome.      Shirley Friar, MD  Electronically Signed     VCS/MEDQ  D:  08/10/2005  T:  08/10/2005  Job:  [id number]   cc:   Melina Fiddler, MD  Fax: [phone]

## 2010-12-11 NOTE — Group Therapy Note (Signed)
[name], [name] NO.:  [id number]   MEDICAL RECORD NO.:  [id number]          PATIENT TYPE:  WOC   LOCATION:  [facility]                   [facility]   PHYSICIAN:  Argentina Donovan, MD        DATE OF BIRTH:  02/02/1981   DATE OF SERVICE:                                    CLINIC NOTE   SUBJECTIVE:  The patient is a 30 year old gravida 2, para 2-0-0-2 who is  being followed for lower abdominal and pelvic pain.  During her last visit,  she was also complaining of intermenstrual spotting.  She has a Mirena IUD  which in addition was given levonorgestrel oral contraceptives without  taking placebos to see whether this could control it.  She is still having  occasional intermenstrual spotting.  She has burning after coitus which does  not seem to be affected or aggravated by soaps or toilet tissue.  She does  not douche.  Since her last visit here which was in August, she has been  seen once in the emergency room where they repeated her ultrasound because  of an ovarian cyst which is now resolved.  She still complains of pain that  usually is mild.  Sometimes it is severe enough to bend her over, and  situated in the lower right pelvis.  She has nocturia at least twice every  night, but the pain seems better after urination and a bowel movement.  The  IUD seems to be low in the uterus.  She has had it for 3 years, and it is a  Mirena by ultrasound visualization.   DISCUSSION:  During this visit, we removed the IUD.  We are going to have  her continue on the __________ .  This may not be strong enough since she  does weight 264 pounds.  I am having her keep a record of her symptoms.  Interstitial cystitis and pelvic adhesions secondary to her cesarean  sections are all considerations.  The ultrasound and CT of the pelvis at the  present time are normal.  We also did a wet prep because of the heavy  leukorrhea and her complaint of postcoital burning every single time  with or  without condom, and she will continue, once again, the oral contraceptives.  We are going to have her stop them for a week to let the endometrium slough,  and see if that controls the spotting.   IMPRESSION:  Chronic intermittent pelvic pain with postcoital burning and  intermenstrual spotting.           ______________________________  Argentina Donovan, MD     PR/MEDQ  D:  05/20/2005  T:  05/20/2005  Job:  [id number]

## 2010-12-11 NOTE — Group Therapy Note (Signed)
NAMEEVELEIGH, CRUMPLER NO.:  [id number]   MEDICAL RECORD NO.:  [id number]          PATIENT TYPE:  WOC   LOCATION:  [facility]                   [facility]   PHYSICIAN:  Argentina Donovan, MD        DATE OF BIRTH:  May 09, 1981   DATE OF SERVICE:                                    CLINIC NOTE   The patient is a 30 year old female with complaint of right lower quadrant  pain.  She has had CAT scans as well as ultrasounds that have failed to  reveal anything other than a disk in the lumbar area.  The pain is  intermittent and tolerable.  She has had a cesarean section with her first  baby followed by a VBAC with a second.  This could be due to scar tissue.  She also suffers from irritable bowel syndrome and acid reflux and is on  medications to control those and seems to be doing well.  The severe vaginal  burning that she has has resolved since we removed her IUD at her last visit  which was out of place.  I have discussed with her, she is 268 pounds, the  possibility of bypass surgery.  She is going to call [place] and go  in for one of the symposiums.  Right now I have told her try to live with  this because she certainly has nothing that is life-threatening.   IMPRESSION:  Chronic right lower quadrant pain, reflux, irritable bowel  syndrome.           ______________________________  Argentina Donovan, MD     PR/MEDQ  D:  07/01/2005  T:  07/02/2005  Job:  [id number]

## 2010-12-11 NOTE — Op Note (Signed)
[name], LIMONES          ACCOUNT NO.:  [id number]   MEDICAL RECORD NO.:  [id number]          PATIENT TYPE:  AMB   LOCATION:  DSC                          FACILITY:  [facility]   PHYSICIAN:  Lucky Cowboy, MD         DATE OF BIRTH:  05-Feb-1981   DATE OF PROCEDURE:  03/26/2005  DATE OF DISCHARGE:  03/26/2005                                 OPERATIVE REPORT   PREOPERATIVE DIAGNOSIS:  Chronic right otitis media, bilateral inferior  turbinate hypertrophy, left neck mass.   POSTOPERATIVE DIAGNOSIS:  Chronic right otitis media, bilateral inferior  turbinate hypertrophy, left neck mass.   PROCEDURE:  Right myringotomy with tube placement, bilateral inferior  turbinate reductions, excisional biopsy, deep left neck mass.   SURGEON:  Dr. Lucky Cowboy.   ANESTHESIA:  General.   ESTIMATED BLOOD LOSS:  Thirty mL.   SPECIMENS:  Left neck mass.   COMPLICATIONS:  None.   INDICATIONS:  This patient is a 30 year old female who has had longstanding  lymphadenopathy which has been more diffuse but now has a residual left zone  2 deep neck mass which has failed to resolve and has been fluctuating in  size. It has been intermittently tender. For this reason, it is excised.  Further, the patient is having right middle ear fluid which keeps recurring  and for this reason a tube is placed. Further, the patient has had ongoing  bilateral nasal obstruction with inferior turbinate hypertrophy unresponsive  to medical therapy.   FINDINGS:  The patient was noted to have a right serous effusion. Both bony  and mucosal inferior turbinate hypertrophy. There was also a 2.5 cm left  zone 2 high deep cervical mass.   PROCEDURE IN DETAIL:  The patient was taken to the operating room and placed  on the table in the supine position. She was then placed under general  endotracheal anesthesia. Before the table was turned, the left neck was  prepped with Betadine and draped in the usual sterile fashion. A  transverse  5 cm incision was made using a #15 blade two fingerbreadths inferior to the  inferior border of the mandible. This was made with a #15 blade and  subcutaneous tissues divided with Bovie cautery as was the platysma muscle.  The platysma flaps were elevated using scissors. The sternocleidomastoid  muscle was then elevated off of its fascial attachments and reflected  posterolaterally. Sharp dissection was performed until the mass was  identified. Carefully, the mass was dissected while staying immediately on  the capsule. This was performed using a hemostat and scissors. The mass was  carefully dissected leaving all other attachments undisturbed. The wound was  copiously irrigated. The platysma was reapproximated in a simple interrupted  buried fashion. The skin was closed in a simple interrupted fashion using 4-  0 Prolene. Bacitracin ointment was applied. At this point, an ear speculum  was placed in the right external auditory canal. With the aid of the  operating microscope, cerumen was removed with a curette and suction. A  myringotomy knife was used to make an incision in the anterior inferior  quadrant  and middle ear fluid evacuated. An Activent tube was placed through  the tympanic membrane and secured in place with a pick. Ciprodex otic was  instilled. The table was then rotated counterclockwise 90 degrees. The head  was draped in the usual fashion. Lidocaine 1% with 1:100,000 of epinephrine  was then used to inject both of the inferior turbinates. After allowing time  for vasoconstrictive effect with Afrin on cottonoid pledgets, the  microdebrider was used to debride the inferior one half of both of the  inferior turbinates. True cut forceps were then used to remove exposed bone.  Suction cautery was used for hemostasis. Telfa packs coated with mupirocin  ointment was packed in each side of the nasal cavity and tied to one another  anterior to the columella. The oral  cavity was suctioned. The table was  rotated clockwise 90 degrees to its original position. The patient was  awakened from anesthesia and taken to the Post Anesthesia Care Unit in  stable condition. There were no complications.      Lucky Cowboy, MD  Electronically Signed     SJ/MEDQ  D:  04/07/2005  T:  04/08/2005  Job:  [id number]   cc:   [facility]   Melina Fiddler, MD  [address] [place]  [place] [address]  Fax: [phone]

## 2011-02-01 ENCOUNTER — Inpatient Hospital Stay (INDEPENDENT_AMBULATORY_CARE_PROVIDER_SITE_OTHER)
Admission: RE | Admit: 2011-02-01 | Discharge: 2011-02-01 | Disposition: A | Discharge status: Home / Self Care | Payer: Self-pay | Source: Ambulatory Visit | Attending: Family Medicine | Admitting: Family Medicine

## 2011-02-01 DIAGNOSIS — L03119 Cellulitis of unspecified part of limb: Secondary | ICD-10-CM

## 2011-02-04 ENCOUNTER — Emergency Department (HOSPITAL_COMMUNITY)
Admission: EM | Admit: 2011-02-04 | Discharge: 2011-02-05 | Disposition: A | Discharge status: Other Acute Inpatient Hospital | Payer: Self-pay | Attending: Emergency Medicine | Admitting: Emergency Medicine

## 2011-02-04 DIAGNOSIS — T50902A Poisoning by unspecified drugs, medicaments and biological substances, intentional self-harm, initial encounter: Secondary | ICD-10-CM | POA: Insufficient documentation

## 2011-02-04 DIAGNOSIS — F329 Major depressive disorder, single episode, unspecified: Secondary | ICD-10-CM | POA: Insufficient documentation

## 2011-02-04 DIAGNOSIS — F3289 Other specified depressive episodes: Secondary | ICD-10-CM | POA: Insufficient documentation

## 2011-02-04 DIAGNOSIS — T50901A Poisoning by unspecified drugs, medicaments and biological substances, accidental (unintentional), initial encounter: Secondary | ICD-10-CM | POA: Insufficient documentation

## 2011-02-04 DIAGNOSIS — R404 Transient alteration of awareness: Secondary | ICD-10-CM | POA: Insufficient documentation

## 2011-02-04 LAB — DIFFERENTIAL
Basophils Absolute: 0 10*3/uL (ref 0.0–0.1)
Eosinophils Relative: 1 % (ref 0–5)
Lymphocytes Relative: 21 % (ref 12–46)
Lymphs Abs: 1 10*3/uL (ref 0.7–4.0)
Neutro Abs: 3.2 10*3/uL (ref 1.7–7.7)
Neutrophils Relative %: 68 % (ref 43–77)

## 2011-02-04 LAB — CULTURE, ROUTINE-ABSCESS

## 2011-02-04 LAB — COMPREHENSIVE METABOLIC PANEL
ALT: 8 U/L (ref 0–35)
Alkaline Phosphatase: 42 U/L (ref 39–117)
Chloride: 101 mEq/L (ref 96–112)
GFR calc Af Amer: 58 mL/min — ABNORMAL LOW (ref >60)
Glucose, Bld: 87 mg/dL (ref 70–99)
Potassium: 3.2 mEq/L — ABNORMAL LOW (ref 3.5–5.1)
Sodium: 137 mEq/L (ref 135–145)
Total Protein: 7.6 g/dL (ref 6.0–8.3)

## 2011-02-04 LAB — RAPID URINE DRUG SCREEN, HOSP PERFORMED
Barbiturates: NOT DETECTED
Benzodiazepines: NOT DETECTED
Cocaine: NOT DETECTED
Tetrahydrocannabinol: POSITIVE — AB

## 2011-02-04 LAB — CBC
HCT: 40.5 % (ref 36.0–46.0)
Hemoglobin: 13.6 g/dL (ref 12.0–15.0)
RBC: 4.67 MIL/uL (ref 3.87–5.11)
RDW: 13 % (ref 11.5–15.5)
WBC: 4.8 10*3/uL (ref 4.0–10.5)

## 2011-02-05 ENCOUNTER — Inpatient Hospital Stay (HOSPITAL_COMMUNITY)
Admission: AD | Admit: 2011-02-05 | Discharge: 2011-02-08 | DRG: 885 | Disposition: A | Discharge status: Home / Self Care | Payer: PRIVATE HEALTH INSURANCE | Source: Ambulatory Visit | Attending: Psychiatry | Admitting: Psychiatry

## 2011-02-05 DIAGNOSIS — F339 Major depressive disorder, recurrent, unspecified: Secondary | ICD-10-CM

## 2011-02-05 DIAGNOSIS — F411 Generalized anxiety disorder: Secondary | ICD-10-CM

## 2011-02-05 DIAGNOSIS — F332 Major depressive disorder, recurrent severe without psychotic features: Principal | ICD-10-CM

## 2011-02-05 DIAGNOSIS — T50902A Poisoning by unspecified drugs, medicaments and biological substances, intentional self-harm, initial encounter: Secondary | ICD-10-CM

## 2011-02-05 DIAGNOSIS — L989 Disorder of the skin and subcutaneous tissue, unspecified: Secondary | ICD-10-CM

## 2011-02-05 DIAGNOSIS — Z560 Unemployment, unspecified: Secondary | ICD-10-CM

## 2011-02-05 DIAGNOSIS — T50901A Poisoning by unspecified drugs, medicaments and biological substances, accidental (unintentional), initial encounter: Secondary | ICD-10-CM

## 2011-02-05 LAB — HEPATIC FUNCTION PANEL
ALT: 8 U/L (ref 0–35)
AST: 15 U/L (ref 0–37)
Albumin: 3.9 g/dL (ref 3.5–5.2)
Alkaline Phosphatase: 41 U/L (ref 39–117)
Total Bilirubin: 0.3 mg/dL (ref 0.3–1.2)
Total Protein: 7.3 g/dL (ref 6.0–8.3)

## 2011-02-05 LAB — BASIC METABOLIC PANEL
Calcium: 9.6 mg/dL (ref 8.4–10.5)
Glucose, Bld: 77 mg/dL (ref 70–99)
Potassium: 3.5 mEq/L (ref 3.5–5.1)

## 2011-02-06 LAB — COMPREHENSIVE METABOLIC PANEL
ALT: 10 U/L (ref 0–35)
BUN: 7 mg/dL (ref 6–23)
CO2: 30 mEq/L (ref 19–32)
Calcium: 9.4 mg/dL (ref 8.4–10.5)
Creatinine, Ser: 1.03 mg/dL (ref 0.50–1.10)
GFR calc Af Amer: >60 mL/min (ref >60)
GFR calc non Af Amer: >60 mL/min (ref >60)
Glucose, Bld: 82 mg/dL (ref 70–99)
Total Protein: 7 g/dL (ref 6.0–8.3)

## 2011-02-08 NOTE — Assessment & Plan Note (Signed)
[name], [name] NO.:  [id number]  MEDICAL RECORD NO.:  [id number]  LOCATION:  0506                          FACILITY:  [facility]  PHYSICIAN:  Franchot Gallo, MD     DATE OF BIRTH:  1981-03-30  DATE OF ADMISSION:  02/05/2011 DATE OF DISCHARGE:                      PSYCHIATRIC ADMISSION ASSESSMENT   DATE OF ASSESSMENT:  February 05, 2011, at 13:40 p.m.  IDENTIFYING INFORMATION:  A 30 year old female.  This is an involuntary admission.  HISTORY OF THE PRESENT ILLNESS:  This is the first [name] admission for [facility] a pleasant, but sad appearing 34 year old mother of 4 who presents after overdosing on a handful of medications of various types that she had in her medicine cabinet.  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  She had her youngest child 3 months ago and the day before the overdose, she broke up with her boyfriend of 1-1/2 years who is also the father of her 2 youngest children.  She reports feeling overwhelmed and sad, having difficulty sleeping and slept about 1 hour last night, some irritability.  Reports at least 2 weeks of crying spells and has had suicidal thoughts for about a week and a half on and off.  Denies homicidal thoughts.  Denies substance abuse.  PAST PSYCHIATRIC HISTORY:  No current outpatient treatment.  She denies a history of substance abuse.  Denies a history of previous treatment with psychotropics.  She does report a history of episodes of depression and had a suicide attempt at age 23 by overdose.  At that time, she was referred to a counselor.  She is not currently receiving any counseling. She denies any learning disabilities.  SOCIAL HISTORY:  She just recently had her 4th child, a daughter, 3 months ago, is struggling financially, working as a Conservator, museum/gallery.  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  She is raising 2 girls and 2 boys by herself as a single mother and reports that her parents are supportive and live nearby.  She does not expect to reconcile the relationship with the youngest 2 children's father.  FAMILY HISTORY:  Denies a family history of mental illness or substance abuse.  ALCOHOL AND DRUG HISTORY:  Denies.  MEDICAL HISTORY: 1. Primary care provider is Dr. Antionette Char, her OB/GYN, and     [facility] is her youngest baby's doctor. 2. Medical problems:  Breast-feeding mother, recent infected skin     lesion left lower leg. 3. Past medical history negative for seizure, negative for diabetes. 4. Surgical history:  Tonsillectomy and adenoidectomy, C-section x1,     reduction of turbinates, myringotomy, removal of abdominal     adhesions and endometrial ablation.  CURRENT MEDICATIONS: 1. Septra DS 2 tablets b.i.d. for 10 days. 2. Prenatal vitamin 1 daily. 3. Tylenol with Codeine No. 3 one to two tablets q.4-6 hours as needed     for pain which she takes rarely for pain with the left leg lesion. 4. Hydrocortisone cream 1% apply to inner thighs p.r.n. for itching.  DRUG ALLERGIES:  PENICILLIN, MONISTAT, NEOSPORIN, BETADINE and IODINE.  POSITIVE PHYSICAL FINDINGS:  Healthy female who appears to be her stated age in no physical distress.  She is complaining of nausea.  Smooth motor exam.  No abnormal movements.  Normal gait.  CBC:  WBC 4.8, hemoglobin 13.6, hematocrit 40.5, platelets 211,000.  Chemistry:  Sodium 137, potassium 3.2, chloride 101, carbon dioxide 25, BUN 8, creatinine 1.31, random glucose 87.  Urine drug screen positive for cannabis and opiates.  Alcohol level negative.  Acetaminophen level less than 15. Salicylate level less than 2.  Liver enzymes are normal.  MENTAL STATUS EXAM:  Fully alert female.  Cooperative.  Readily tearful with fragile affect.  Good concentration.  Good eye  contact. Cooperative.  Appears to be very sad.  Gives a coherent history.  Mood is depressed.  Speech is normal in pace, tone and production. Articulate.  Thinking is logical, goal-directed.  Admits that she was suicidal, feeling very low.  No homicidal thoughts.  Cognitively, her insight is good.  Working and remote memory are intact.  Insight is satisfactory.  Judgment and impulse control are normal.  Axis I:  Rule out major depressive disorder, cannabis abuse. Axis II:  Deferred. Axis III:  Post polypharmacy overdose.  Note:  Lactating female.  Skin lesion left lower leg. Axis IV:  Severe issues with relationship breakup and financial pressures.  Supportive family is an asset. Axis V:  Current 40.  Past year not known.  PLAN:  Will admit her to our mood disorders program.  We are going to repeat her BMET and liver enzymes since she has a mildly elevated creatinine at 1.31.  We will continue her antibiotics.  She is breast- feeding, and we are going to allow her to have her breast pump while here.  We will get Pharmacy's input and review risks and benefits of an antidepressant regimen.     [name], N.P.   ______________________________ Franchot Gallo, MD    MAS/MEDQ  D:  02/05/2011  T:  02/05/2011  Job:  [id number]  Electronically Signed by Kari Baars N.P. on 02/08/2011 08:17:13 AM Electronically Signed by Franchot Gallo MD on 02/08/2011 12:04:18 PM

## 2011-02-10 NOTE — Discharge Summary (Signed)
[name], [name] NO.:  [id number]  MEDICAL RECORD NO.:  [id number]  LOCATION:  0506                          FACILITY:  [facility]  PHYSICIAN:  Franchot Gallo, MD     DATE OF BIRTH:  [birth year]  DATE OF ADMISSION:  02/05/2011 DATE OF DISCHARGE:  02/08/2011                              DISCHARGE SUMMARY   REASON FOR ADMISSION:  This was a 30 year old female that was admitted after overdosing on a handful of medications of various types, feeling very depressed, having significant financial stressors.  The patient was feeling very overwhelmed and sad and having trouble with sleep.  She denied any substance use and denied any homicidal thoughts.  FINAL IMPRESSION:  AXIS I:  Major depressive disorder recurrent, moderate to severe, generalized anxiety disorder. AXIS II: Deferred. AXIS III:  Post polypharmacy overdose, lactating female, skin lesion left lower leg. AXIS IV: Severe issues with relationship breakup and financial pressures.  Supportive family was an asset. AXIS V: Current is 50-55.  PERTINENT LABS:  CBC showed a white count 4.8, hemoglobin of 13.6, hematocrit of 40.5.  Urine drug screen positive for cannabis and opiates.  Alcohol level was negative.  Acetaminophen level less than 15. Salicylate less than 2.  SIGNIFICANT FINDINGS:  The patient was admitted to the adult milieu to the mood disorder group, continued her antibiotics for her skin lesion. The patient was active in groups.  She was denied any suicidal thoughts but reporting depressive symptoms.  The patient was having trouble with sleep, waking up with the headaches and nausea, her appetite was decreased.  She was reporting depressive symptoms rating it is any on a scale of 1-10, having no suicidal or homicidal thoughts or any psychotic symptoms, reporting anxiety, rating it a 9 on a scale of 1-10.  The patient was experiencing some nausea which we felt was from her antibiotics.  We  increased her Zoloft to 75 mg and had tramadol available for pain.  Her sleep was improving.  Her appetite was improving.  She was taking Ensure as needed.  Her depressive symptoms had resolved.  Denied any suicidal or homicidal thoughts or any psychotic symptoms.  On day of discharge, the patient's sleep was good. Her appetite was improving but not quite normal.  Depressive symptoms resolved rating it a 0 on a scale of 1-10, adamantly denied any suicidal or homicidal thoughts.  No auditory or visual hallucinations, reporting her anxiety a 2 on a scale of 1-10 and having no medication side effects.  DISCHARGE MEDICATIONS: 1. Clindamycin 300 mg one t.i.d. 2. Zofran 1 tablet q. 6 hours p.r.n. for nausea. 3. Zoloft 50 mg taking 1-1/2 tablets daily. 4. Prenatal vitamin daily.  FOLLOWUP:  Her follow-up appointment was with [facility] at phone number [phone] which the patient was to walk as a walk-in and appointment with [facility] on Friday July 20 at 4:00 p.m., phone number [phone].     Landry Corporal, N.P.   ______________________________ Franchot Gallo, MD    JO/MEDQ  D:  02/09/2011  T:  02/10/2011  Job:  [id number]  Electronically Signed by Limmie PatriciaP. on 02/10/2011 09:48:21 AM Electronically Signed by Franchot Gallo MD on 02/10/2011  12:01:07 PM

## 2011-04-19 LAB — URINALYSIS, ROUTINE W REFLEX MICROSCOPIC
Nitrite: NEGATIVE
Protein, ur: 100 — AB
Specific Gravity, Urine: 1.021
Urobilinogen, UA: 0.2

## 2011-04-19 LAB — PREGNANCY, URINE: Preg Test, Ur: NEGATIVE

## 2011-04-19 LAB — URINE MICROSCOPIC-ADD ON

## 2011-04-30 LAB — COMPREHENSIVE METABOLIC PANEL
ALT: <8
AST: 14
Albumin: 2.4 — ABNORMAL LOW
Alkaline Phosphatase: 118 — ABNORMAL HIGH
Chloride: 104
GFR calc Af Amer: >60
Potassium: 4
Total Bilirubin: 0.3

## 2011-04-30 LAB — URINALYSIS, ROUTINE W REFLEX MICROSCOPIC
Bilirubin Urine: NEGATIVE
Glucose, UA: NEGATIVE
Protein, ur: NEGATIVE
Specific Gravity, Urine: 1.02

## 2011-04-30 LAB — CBC
HCT: 36.2
Hemoglobin: 12.5
MCHC: 34.4
MCV: 88.8
Platelets: 242
RBC: 4.03
RDW: 12.9
WBC: 11.3 — ABNORMAL HIGH

## 2011-04-30 LAB — URINE MICROSCOPIC-ADD ON

## 2011-05-03 LAB — URINALYSIS, ROUTINE W REFLEX MICROSCOPIC
Bilirubin Urine: NEGATIVE
Hgb urine dipstick: NEGATIVE
Specific Gravity, Urine: 1.02
Urobilinogen, UA: 0.2

## 2011-05-04 LAB — URINALYSIS, ROUTINE W REFLEX MICROSCOPIC
Bilirubin Urine: NEGATIVE
Glucose, UA: NEGATIVE
Hgb urine dipstick: NEGATIVE
Ketones, ur: NEGATIVE
Protein, ur: NEGATIVE
pH: 6.5

## 2012-08-06 ENCOUNTER — Encounter (HOSPITAL_COMMUNITY): Payer: Self-pay | Admitting: *Deleted

## 2012-08-06 ENCOUNTER — Emergency Department (HOSPITAL_COMMUNITY)
Admission: EM | Admit: 2012-08-06 | Discharge: 2012-08-06 | Disposition: A | Discharge status: Home / Self Care | Payer: Self-pay | Attending: Emergency Medicine | Admitting: Emergency Medicine

## 2012-08-06 DIAGNOSIS — J069 Acute upper respiratory infection, unspecified: Secondary | ICD-10-CM | POA: Insufficient documentation

## 2012-08-06 DIAGNOSIS — Z87891 Personal history of nicotine dependence: Secondary | ICD-10-CM | POA: Insufficient documentation

## 2012-08-06 DIAGNOSIS — H6690 Otitis media, unspecified, unspecified ear: Secondary | ICD-10-CM

## 2012-08-06 MED ORDER — ANTIPYRINE-BENZOCAINE 5.4-1.4 % OT SOLN: 3.0000 [drp] | Freq: Four times a day (QID) | OTIC | Status: DC | PRN

## 2012-08-06 MED ORDER — LEVOFLOXACIN 750 MG PO TABS: 750.0000 mg | ORAL_TABLET | Freq: Every day | ORAL | Status: DC

## 2012-08-06 NOTE — ED Provider Notes (Signed)
History   This chart was scribed for Wynetta Emery, PA by Leone Payor, ED Scribe. This patient was seen in room TR08C/TR08C and the patient's care was started at 2109.  CSN: [id number]  Arrival date & time 08/06/12  1819   None     Chief Complaint  Patient presents with  . URI  . Otalgia     The history is provided by the patient. No language interpreter was used.    [name] is a 32 y.o. female who presents to the Emergency Department complaining of pain to both ears in addition to tinnitus to bilateral ears. Pt has recently been recovering from an URI with symptoms of runny nose, congestion and chills.  She denies fever, nausea, vomiting, visual disturbances.     Pt is allergic to penicillins.  Pt is a former smoker but denies alcohol use.   History reviewed. No pertinent past medical history.  History reviewed. No pertinent past surgical history.  History reviewed. No pertinent family history.  History  Substance Use Topics  . Smoking status: Former Games developer  . Smokeless tobacco: Not on file  . Alcohol Use: No    No OB history provided.   Review of Systems  Constitutional: Negative for fever.  HENT: Positive for ear pain and tinnitus.   Eyes: Negative for visual disturbance.  Respiratory: Negative for shortness of breath.   Cardiovascular: Negative for chest pain.  Gastrointestinal: Negative for nausea, vomiting, abdominal pain and diarrhea.  All other systems reviewed and are negative.    Allergies  Betadine; Monistat; Neosporin; and Penicillins  Home Medications   Current Outpatient Rx  Name  Route  Sig  Dispense  Refill  . CETIRIZINE HCL 10 MG PO TABS   Oral   Take 10 mg by mouth at bedtime.         Marland Kitchen LEVONORGESTREL 20 MCG/24HR IU IUD   Intrauterine   1 each by Intrauterine route once.           BP 124/65  Pulse 73  Temp 98.6 F (37 C) (Oral)  Resp 18  SpO2 99%  Physical Exam  Nursing note and vitals  reviewed. Constitutional: She is oriented to person, place, and time. She appears well-developed and well-nourished. No distress.  HENT:  Head: Normocephalic.       Blunting of light reflex to the left TM with no bulging noted. Rt TM shows bulging with severe erythema and purulent effusion.   Eyes: Conjunctivae normal and EOM are normal.  Cardiovascular: Normal rate.   Pulmonary/Chest: Effort normal. No stridor.  Musculoskeletal: Normal range of motion.  Neurological: She is alert and oriented to person, place, and time.  Psychiatric: She has a normal mood and affect.    ED Course  Procedures (including critical care time)  DIAGNOSTIC STUDIES: Oxygen Saturation is 99% on room air, normal by my interpretation.    COORDINATION OF CARE:   9:25 PM Discussed treatment plan which includes oral antibiotics with pt at bedside and pt agreed to plan.    Labs Reviewed - No data to display No results found.   1. Otitis media       MDM     Pt verbalized understanding and agrees with care plan. Outpatient follow-up and return precautions given.    New Prescriptions   ANTIPYRINE-BENZOCAINE (AURALGAN) OTIC SOLUTION    Place 3 drops into both ears 4 (four) times daily as needed for pain.   LEVOFLOXACIN (LEVAQUIN) 750 MG TABLET  Take 1 tablet (750 mg total) by mouth daily.    I personally performed the services described in this documentation, which was scribed in my presence. The recorded information has been reviewed and is accurate.    Wynetta Emery, PA-C 08/09/12 [phone]

## 2012-08-06 NOTE — ED Notes (Signed)
Pt reports having cough/cold symptoms. Now has pain to left ear and fluid to right ear.

## 2012-08-08 NOTE — ED Notes (Signed)
Prescription for erythromycin 500mg  po q8h dispense 21, no refills called to [facility] at [phone] per chris lawyer, pa-c.

## 2012-08-08 NOTE — ED Notes (Signed)
States erythromycin too expensive; prescription for keflex 500mg  po qid #40, no refills called in to [facility] at [phone].

## 2012-08-10 NOTE — ED Provider Notes (Signed)
Medical screening examination/treatment/procedure(s) were performed by non-physician practitioner and as supervising physician I was immediately available for consultation/collaboration.   Tamitha Norell E Veretta Sabourin, MD 08/10/12 0842 

## 2013-05-13 ENCOUNTER — Emergency Department (HOSPITAL_COMMUNITY)
Admission: EM | Admit: 2013-05-13 | Discharge: 2013-05-13 | Disposition: A | Discharge status: Home / Self Care | Payer: Medicaid Other | Attending: Emergency Medicine | Admitting: Emergency Medicine

## 2013-05-13 ENCOUNTER — Encounter (HOSPITAL_COMMUNITY): Payer: Self-pay | Admitting: Emergency Medicine

## 2013-05-13 DIAGNOSIS — J029 Acute pharyngitis, unspecified: Secondary | ICD-10-CM | POA: Insufficient documentation

## 2013-05-13 DIAGNOSIS — Z880 Allergy status to penicillin: Secondary | ICD-10-CM | POA: Insufficient documentation

## 2013-05-13 DIAGNOSIS — Z87891 Personal history of nicotine dependence: Secondary | ICD-10-CM | POA: Insufficient documentation

## 2013-05-13 DIAGNOSIS — R220 Localized swelling, mass and lump, head: Secondary | ICD-10-CM | POA: Insufficient documentation

## 2013-05-13 DIAGNOSIS — Z79899 Other long term (current) drug therapy: Secondary | ICD-10-CM | POA: Insufficient documentation

## 2013-05-13 DIAGNOSIS — H9202 Otalgia, left ear: Secondary | ICD-10-CM

## 2013-05-13 DIAGNOSIS — M26609 Unspecified temporomandibular joint disorder, unspecified side: Secondary | ICD-10-CM | POA: Insufficient documentation

## 2013-05-13 DIAGNOSIS — K029 Dental caries, unspecified: Secondary | ICD-10-CM | POA: Insufficient documentation

## 2013-05-13 DIAGNOSIS — Z888 Allergy status to other drugs, medicaments and biological substances status: Secondary | ICD-10-CM | POA: Insufficient documentation

## 2013-05-13 DIAGNOSIS — H9209 Otalgia, unspecified ear: Secondary | ICD-10-CM | POA: Insufficient documentation

## 2013-05-13 MED ORDER — CLINDAMYCIN HCL 150 MG PO CAPS: 150.0000 mg | ORAL_CAPSULE | Freq: Four times a day (QID) | ORAL | Status: DC

## 2013-05-13 MED ORDER — HYDROCODONE-ACETAMINOPHEN 5-325 MG PO TABS: 1.0000 | ORAL_TABLET | ORAL | Status: DC | PRN

## 2013-05-13 NOTE — ED Notes (Signed)
Pt c/o left sided earache. Denies N/v/d, generalized body aches, or fever.

## 2013-05-13 NOTE — ED Provider Notes (Signed)
TIME SEEN: 5:45 AM  CHIEF COMPLAINT: Left ear pain  HPI: Patient is a 32 y.o. 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 She states it is worse with lying on that side of her face. Denies any trauma to the face. She has had mild facial swelling. She states this does not feel like her EMG. It is a throbbing pain that is moderate in nature. No radiation. No alleviating factors. No drainage from her ear. No hearing loss or tinnitus. No fever, chills, nausea, vomiting or diarrhea. She has had mild sore throat. No dental pain that she is aware of.  ROS: See HPI Constitutional: no fever  Eyes: no drainage  ENT: no runny nose   Cardiovascular:  no chest pain  Resp: no SOB  GI: no vomiting GU: no dysuria Integumentary: no rash  Allergy: no hives  Musculoskeletal: no leg swelling  Neurological: no slurred speech ROS otherwise negative  PAST MEDICAL HISTORY/PAST SURGICAL HISTORY:  History reviewed. No pertinent past medical history.  MEDICATIONS:  Prior to Admission medications   Medication Sig Start Date End Date Taking? Authorizing Provider  albuterol (PROVENTIL HFA;VENTOLIN HFA) 108 (90 BASE) MCG/ACT inhaler Inhale 2 puffs into the lungs every 6 (six) hours as needed for wheezing.   Yes Historical Provider, MD  cetirizine (ZYRTEC) 10 MG tablet Take 10 mg by mouth at bedtime.   Yes Historical Provider, MD  levonorgestrel (MIRENA) 20 MCG/24HR IUD 1 each by Intrauterine route once.    Historical Provider, MD    ALLERGIES:  Allergies  Allergen Reactions  . Penicillins Anaphylaxis  . Monistat [Miconazole Nitrate-Wipes] Other (See Comments)    'cracks my skin open'  . Neosporin [Neomycin-Bacitracin Zn-Polymyx] Other (See Comments)    'cracks my skin open'  . Betadine [Povidone Iodine] Hives and Rash    SOCIAL HISTORY:  History  Substance Use Topics  . Smoking status: Former Games developer  . Smokeless tobacco: Not on file  .  Alcohol Use: No    FAMILY HISTORY: No family history on file.  EXAM: BP 128/88  Pulse 98  Temp(Src) 98.9 F (37.2 C) (Oral)  Ht 5\' 4"  (1.626 m)  Wt 187 lb (84.823 kg)  BMI 32.08 kg/m2  SpO2 100%  LMP 05/07/2013 CONSTITUTIONAL: Alert and oriented and responds appropriately to questions. Well-appearing; well-nourished HEAD: Normocephalic EYES: Conjunctivae clear, PERRL ENT: normal nose; no rhinorrhea; moist mucous membranes; pharynx without lesions noted; TMs are clear bilaterally, patient has no swelling over her neck, full range of motion of her neck; patient has a dental caries in her left third molar with gingival swelling with no obvious abscess, no trismus or drooling, no voice changes; no signs of mastoiditis NECK: Supple, no meningismus, no LAD  CARD: RRR; S1 and S2 appreciated; no murmurs, no clicks, no rubs, no gallops RESP: Normal chest excursion without splinting or tachypnea; breath sounds clear and equal bilaterally; no wheezes, no rhonchi, no rales,  ABD/GI: Normal bowel sounds; non-distended; soft, non-tender, no rebound, no guarding BACK:  The back appears normal and is non-tender to palpation, there is no CVA tenderness EXT: Normal ROM in all joints; non-tender to palpation; no edema; normal capillary refill; no cyanosis    SKIN: Normal color for age and race; warm NEURO: Moves all extremities equally PSYCH: The patient's mood and manner are appropriate. Grooming and personal hygiene are appropriate.  MEDICAL DECISION MAKING: Patient with possible dental infection as the cause of her left-sided facial pain  and ear pain. Will place on antibiotics, given dental followup information, return precautions and pain medication. Patient verbalizes understanding is comfortable plan.      Layla Maw Ward, DO 05/13/13 [phone]

## 2013-11-06 ENCOUNTER — Encounter (HOSPITAL_COMMUNITY): Payer: Self-pay | Admitting: Emergency Medicine

## 2013-11-06 ENCOUNTER — Emergency Department (HOSPITAL_COMMUNITY)
Admission: EM | Admit: 2013-11-06 | Discharge: 2013-11-06 | Disposition: A | Discharge status: Home / Self Care | Payer: Medicaid Other | Attending: Emergency Medicine | Admitting: Emergency Medicine

## 2013-11-06 DIAGNOSIS — M7660 Achilles tendinitis, unspecified leg: Secondary | ICD-10-CM | POA: Insufficient documentation

## 2013-11-06 DIAGNOSIS — M7661 Achilles tendinitis, right leg: Secondary | ICD-10-CM

## 2013-11-06 DIAGNOSIS — Z79899 Other long term (current) drug therapy: Secondary | ICD-10-CM | POA: Insufficient documentation

## 2013-11-06 DIAGNOSIS — Z87891 Personal history of nicotine dependence: Secondary | ICD-10-CM | POA: Insufficient documentation

## 2013-11-06 DIAGNOSIS — Z880 Allergy status to penicillin: Secondary | ICD-10-CM | POA: Insufficient documentation

## 2013-11-06 DIAGNOSIS — Z87828 Personal history of other (healed) physical injury and trauma: Secondary | ICD-10-CM | POA: Insufficient documentation

## 2013-11-06 MED ORDER — HYDROCODONE-ACETAMINOPHEN 5-325 MG PO TABS: 1.0000 | ORAL_TABLET | ORAL | Status: DC | PRN

## 2013-11-06 MED ORDER — NAPROXEN 500 MG PO TABS: 500.0000 mg | ORAL_TABLET | Freq: Two times a day (BID) | ORAL | Status: DC

## 2013-11-06 NOTE — ED Notes (Signed)
Pt states she initially injured her right ankle on the 4th and was seen at the hospital on the 5th  Pt has an ASO brace and crutches  Pt states now she is having pain on the outside of her ankle and in the back at her achilles tendon  Pt states when she points her toes down toward the floor she has a shooting pain up the back of her leg to her calf

## 2013-11-06 NOTE — ED Provider Notes (Signed)
CSN: [id number]     Arrival date & time 11/06/13  0325 History   First MD Initiated Contact with Patient 11/06/13 [phone]     Chief Complaint  Patient presents with  . Ankle Pain     (Consider location/radiation/quality/duration/timing/severity/associated sxs/prior Treatment) Patient is a 33 y.o. female presenting with ankle pain. The history is provided by the patient. No language interpreter was used.  Ankle Pain Location:  Ankle Injury: yes   Mechanism of injury: fall   Mechanism of injury comment:  Mechanical on 10/27/13; imaging on 10/27/13 negative for fx Ankle location:  R ankle Pain details:    Quality:  Sharp, shooting and aching   Radiates to: R calf.   Severity:  Moderate   Onset quality:  Gradual   Duration:  3 days   Timing:  Constant   Progression:  Waxing and waning Chronicity:  New Dislocation: no   Relieved by:  Rest Exacerbated by: plantar flexion and calf palpation. Ineffective treatments: Tramadol. Associated symptoms: decreased ROM   Associated symptoms: no fever, no muscle weakness, no numbness, no swelling and no tingling     History reviewed. No pertinent past medical history. Past Surgical History  Procedure Laterality Date  . Tonsillectomy    . Adenoidectomy    . Cesarean section    . Lymph node disection    . Reduction of turbinates    . Myringotomy     Family History  Problem Relation Age of Onset  . Hepatitis C Mother   . Hepatitis C Father   . Diabetes Other   . Hypertension Other   . Arthritis Other    History  Substance Use Topics  . Smoking status: Former Games developermoker  . Smokeless tobacco: Not on file  . Alcohol Use: No   OB History   Grav Para Term Preterm Abortions TAB SAB Ect Mult Living                 Review of Systems  Constitutional: Negative for fever.  Musculoskeletal: Positive for myalgias.  Skin: Negative for color change and pallor.  Neurological: Negative for weakness and numbness.  All other systems reviewed and are  negative.     Allergies  Penicillins; Monistat; Neosporin; and Betadine  Home Medications   Current Outpatient Rx  Name  Route  Sig  Dispense  Refill  . cetirizine (ZYRTEC) 10 MG tablet   Oral   Take 10 mg by mouth at bedtime.         Marland Kitchen. levonorgestrel (MIRENA) 20 MCG/24HR IUD   Intrauterine   1 each by Intrauterine route once.         . traMADol (ULTRAM) 50 MG tablet   Oral   Take 50 mg by mouth every 6 (six) hours as needed (pain).         Marland Kitchen. HYDROcodone-acetaminophen (NORCO/VICODIN) 5-325 MG per tablet   Oral   Take 1 tablet by mouth every 4 (four) hours as needed.   17 tablet   0   . naproxen (NAPROSYN) 500 MG tablet   Oral   Take 1 tablet (500 mg total) by mouth 2 (two) times daily.   30 tablet   0    BP 112/57  Pulse 71  Temp(Src) 98.1 F (36.7 C) (Oral)  Resp 18  Ht 5\' 4"  (1.626 m)  Wt 208 lb 4 oz (94.462 kg)  BMI 35.73 kg/m2  SpO2 100%  Physical Exam  Nursing note and vitals reviewed. Constitutional: She is oriented  to person, place, and time. She appears well-developed and well-nourished. No distress.  HENT:  Head: Normocephalic and atraumatic.  Eyes: Conjunctivae and EOM are normal. No scleral icterus.  Neck: Normal range of motion.  Cardiovascular: Normal rate, regular rhythm and intact distal pulses.   Pulmonary/Chest: Effort normal. No respiratory distress.  Musculoskeletal: She exhibits tenderness.  TTP of the R achilles tendon without significant swelling. No erythema or heat to touch. Pain with plantar flexion of R foot. Limited ROM of R ankle secondary to pain.  Neurological: She is alert and oriented to person, place, and time.  No numbness, tingling or weakness of the affected extremity  Skin: Skin is warm and dry. No rash noted. She is not diaphoretic. No erythema. No pallor.  Psychiatric: She has a normal mood and affect. Her behavior is normal.    ED Course  Procedures (including critical care time) Labs Review Labs Reviewed  - No data to display Imaging Review No results found.   EKG Interpretation None      MDM   Final diagnoses:  Tendonitis, Achilles, right    33 year old female presents for pain to her posterior right ankle. Patient endorses a mechanical fall on 10/27/2013. Patient was seen at a hospital following the fall with negative imaging at this time. Patient states that pain in her right ankle has worsened over the last few days. Physical exam findings clinically consistent with Achilles tendinitis. No evidence of septic joint. No crepitus, deformities, or effusions of right ankle. Patient neurovascularly intact without sensory deficits. She is stable and appropriate for d/c with RICE instructions and NSAIDs as well as orthopedic referral. Return precautions discussed and patient agreeable to plan with no unaddressed concerns.   Filed Vitals:   11/06/13 0328 11/06/13 0344 11/06/13 0455  BP: 122/82 125/83 112/57  Pulse: 99 83 71  Temp: 98.7 F (37.1 C) 98.1 F (36.7 C)   TempSrc: Oral Oral   Resp: 18 20 18   Height: 5\' 4"  (1.626 m) 5\' 4"  (1.626 m)   Weight: 208 lb (94.348 kg) 208 lb 4 oz (94.462 kg)   SpO2: 99% 100% 100%     Antony MaduraKelly Kadance Mccuistion, PA-C 11/06/13 0515

## 2013-11-06 NOTE — Discharge Instructions (Signed)
Achilles Tendinitis  Achilles tendinitis is inflammation of the tough, cord-like band that attaches the lower muscles of your leg to your heel (Achilles tendon). It is usually caused by overusing the tendon and joint involved.   CAUSES  Achilles tendinitis can happen because of:  · A sudden increase in exercise or activity (such as running).  · Doing the same exercises or activities (such as jumping) over and over.  · Not warming up calf muscles before exercising.  · Exercising in shoes that are worn out or not made for exercise.  · Having arthritis or a bone growth on the back of the heel bone. This can rub against the tendon and hurt the tendon.  SIGNS AND SYMPTOMS  The most common symptoms are:  · Pain in the back of the leg, just above the heel. The pain usually gets worse with exercise and better with rest.  · Stiffness or soreness in the back of the leg, especially in the morning.  · Swelling of the skin over the Achilles tendon.  · Trouble standing on tiptoe.  Sometimes, an Achilles tendon tears (ruptures). Symptoms of an Achilles tendon rupture can include:  · Sudden, severe pain in the back of the leg.  · Trouble putting weight on the foot or walking normally.  DIAGNOSIS  Achilles tendinitis will be diagnosed based on symptoms and a physical examination. An X-ray may be done to check if another condition is causing your symptoms. An MRI may be ordered if your health care provider suspects you may have completely torn your tendon, which is called an Achilles tendon rupture.   TREATMENT   Achilles tendinitis usually gets better over time. It can take weeks to months to heal completely. Treatment focuses on treating the symptoms and helping the injury heal.  HOME CARE INSTRUCTIONS   · Rest your Achilles tendon and avoid activities that cause pain.  · Apply ice to the injured area:  · Put ice in a plastic bag.  · Place a towel between your skin and the bag.  · Leave the ice on for 20 minutes, 2 3 times a  day  · Try to avoid using the tendon (other than gentle range of motion) while the tendon is painful. Do not resume use until instructed by your health care provider. Then begin use gradually. Do not increase use to the point of pain. If pain does develop, decrease use and continue the above measures. Gradually increase activities that do not cause discomfort until you achieve normal use.  · Do exercises to make your calf muscles stronger and more flexible. Your health care provider or physical therapist can recommend exercises for you to do.  · Wrap your ankle with an elastic bandage or other wrap. This can help keep your tendon from moving too much. Your health care provider will show you how to wrap your ankle correctly.  · Only take over-the-counter or prescription medicines for pain, discomfort, or fever as directed by your health care provider.  SEEK MEDICAL CARE IF:   · Your pain and swelling increase or pain is uncontrolled with medicines.  · You develop new, unexplained symptoms or your symptoms get worse.  · You are unable to move your toes or foot.  · You develop warmth and swelling in your foot.  · You have an unexplained temperature.  MAKE SURE YOU:   · Understand these instructions.  · Will watch your condition.  · Will get help right away if you   are not doing well or get worse.  Document Released: 04/21/2005 Document Revised: 05/02/2013 Document Reviewed: 02/21/2013  ExitCare® Patient Information ©2014 [employer].    RICE: Routine Care for Injuries  The routine care of many injuries includes Rest, Ice, Compression, and Elevation (RICE).  HOME CARE INSTRUCTIONS  · Rest is needed to allow your body to heal. Routine activities can usually be resumed when comfortable. Injured tendons and bones can take up to 6 weeks to heal. Tendons are the cord-like structures that attach muscle to bone.  · Ice following an injury helps keep the swelling down and reduces pain.  · Put ice in a plastic bag.  · Place a  towel between your skin and the bag.  · Leave the ice on for 15-20 minutes, 03-04 times a day. Do this while awake, for the first 24 to 48 hours. After that, continue as directed by your caregiver.  · Compression helps keep swelling down. It also gives support and helps with discomfort. If an elastic bandage has been applied, it should be removed and reapplied every 3 to 4 hours. It should not be applied tightly, but firmly enough to keep swelling down. Watch fingers or toes for swelling, bluish discoloration, coldness, numbness, or excessive pain. If any of these problems occur, remove the bandage and reapply loosely. Contact your caregiver if these problems continue.  · Elevation helps reduce swelling and decreases pain. With extremities, such as the arms, hands, legs, and feet, the injured area should be placed near or above the level of the heart, if possible.  SEEK IMMEDIATE MEDICAL CARE IF:  · You have persistent pain and swelling.  · You develop redness, numbness, or unexpected weakness.  · Your symptoms are getting worse rather than improving after several days.  These symptoms may indicate that further evaluation or further X-rays are needed. Sometimes, X-rays may not show a small broken bone (fracture) until 1 week or 10 days later. Make a follow-up appointment with your caregiver. Ask when your X-ray results will be ready. Make sure you get your X-ray results.  Document Released: 10/24/2000 Document Revised: 10/04/2011 Document Reviewed: 12/11/2010  ExitCare® Patient Information ©2014 [employer].

## 2013-11-07 NOTE — ED Provider Notes (Signed)
Medical screening examination/treatment/procedure(s) were performed by non-physician practitioner and as supervising physician I was immediately available for consultation/collaboration.   EKG Interpretation None       Ottilie Wigglesworth F Kilyn Maragh, MD 11/07/13 0441 

## 2014-01-10 ENCOUNTER — Emergency Department (HOSPITAL_COMMUNITY): Payer: Self-pay

## 2014-01-10 ENCOUNTER — Emergency Department (HOSPITAL_COMMUNITY): Payer: Medicaid Other

## 2014-01-10 ENCOUNTER — Emergency Department (HOSPITAL_COMMUNITY)
Admission: EM | Admit: 2014-01-10 | Discharge: 2014-01-10 | Disposition: A | Discharge status: Home / Self Care | Payer: Self-pay | Attending: Emergency Medicine | Admitting: Emergency Medicine

## 2014-01-10 ENCOUNTER — Encounter (HOSPITAL_COMMUNITY): Payer: Self-pay | Admitting: Emergency Medicine

## 2014-01-10 DIAGNOSIS — F410 Panic disorder [episodic paroxysmal anxiety] without agoraphobia: Secondary | ICD-10-CM | POA: Insufficient documentation

## 2014-01-10 DIAGNOSIS — R112 Nausea with vomiting, unspecified: Secondary | ICD-10-CM | POA: Insufficient documentation

## 2014-01-10 DIAGNOSIS — F4310 Post-traumatic stress disorder, unspecified: Secondary | ICD-10-CM | POA: Insufficient documentation

## 2014-01-10 DIAGNOSIS — R197 Diarrhea, unspecified: Secondary | ICD-10-CM | POA: Insufficient documentation

## 2014-01-10 DIAGNOSIS — Z888 Allergy status to other drugs, medicaments and biological substances status: Secondary | ICD-10-CM | POA: Insufficient documentation

## 2014-01-10 DIAGNOSIS — Z87891 Personal history of nicotine dependence: Secondary | ICD-10-CM | POA: Insufficient documentation

## 2014-01-10 DIAGNOSIS — F411 Generalized anxiety disorder: Secondary | ICD-10-CM | POA: Insufficient documentation

## 2014-01-10 DIAGNOSIS — M25511 Pain in right shoulder: Secondary | ICD-10-CM

## 2014-01-10 DIAGNOSIS — K219 Gastro-esophageal reflux disease without esophagitis: Secondary | ICD-10-CM | POA: Insufficient documentation

## 2014-01-10 DIAGNOSIS — Z880 Allergy status to penicillin: Secondary | ICD-10-CM | POA: Insufficient documentation

## 2014-01-10 DIAGNOSIS — R1011 Right upper quadrant pain: Secondary | ICD-10-CM | POA: Insufficient documentation

## 2014-01-10 DIAGNOSIS — Z79899 Other long term (current) drug therapy: Secondary | ICD-10-CM | POA: Insufficient documentation

## 2014-01-10 HISTORY — DX: Anxiety disorder, unspecified: F41.9

## 2014-01-10 HISTORY — DX: Panic disorder (episodic paroxysmal anxiety): F41.0

## 2014-01-10 HISTORY — DX: Post-traumatic stress disorder, unspecified: F43.10

## 2014-01-10 HISTORY — DX: Gastro-esophageal reflux disease without esophagitis: K21.9

## 2014-01-10 LAB — CBC WITH DIFFERENTIAL/PLATELET
Basophils Absolute: 0 10*3/uL (ref 0.0–0.1)
Basophils Relative: 0 % (ref 0–1)
Eosinophils Absolute: 0.1 10*3/uL (ref 0.0–0.7)
Eosinophils Relative: 1 % (ref 0–5)
HCT: 41.7 % (ref 36.0–46.0)
Hemoglobin: 14.2 g/dL (ref 12.0–15.0)
LYMPHS ABS: 2.8 10*3/uL (ref 0.7–4.0)
LYMPHS PCT: 32 % (ref 12–46)
MCH: 30.5 pg (ref 26.0–34.0)
MCHC: 34.1 g/dL (ref 30.0–36.0)
MCV: 89.5 fL (ref 78.0–100.0)
Monocytes Absolute: 0.5 10*3/uL (ref 0.1–1.0)
Monocytes Relative: 5 % (ref 3–12)
NEUTROS PCT: 62 % (ref 43–77)
Neutro Abs: 5.5 10*3/uL (ref 1.7–7.7)
PLATELETS: 270 10*3/uL (ref 150–400)
RBC: 4.66 MIL/uL (ref 3.87–5.11)
RDW: 13 % (ref 11.5–15.5)
WBC: 8.9 10*3/uL (ref 4.0–10.5)

## 2014-01-10 LAB — COMPREHENSIVE METABOLIC PANEL
ALK PHOS: 53 U/L (ref 39–117)
ALT: 16 U/L (ref 0–35)
AST: 24 U/L (ref 0–37)
Albumin: 4.1 g/dL (ref 3.5–5.2)
BUN: 9 mg/dL (ref 6–23)
CO2: 23 meq/L (ref 19–32)
Calcium: 9.8 mg/dL (ref 8.4–10.5)
Chloride: 103 mEq/L (ref 96–112)
Creatinine, Ser: 0.91 mg/dL (ref 0.50–1.10)
GFR, EST NON AFRICAN AMERICAN: 83 mL/min — AB (ref >90)
GLUCOSE: 87 mg/dL (ref 70–99)
POTASSIUM: 3.6 meq/L — AB (ref 3.7–5.3)
SODIUM: 139 meq/L (ref 137–147)
TOTAL PROTEIN: 7.9 g/dL (ref 6.0–8.3)
Total Bilirubin: 0.5 mg/dL (ref 0.3–1.2)

## 2014-01-10 LAB — LIPASE, BLOOD: Lipase: 18 U/L (ref 11–59)

## 2014-01-10 MED ORDER — SODIUM CHLORIDE 0.9 % IV BOLUS (SEPSIS)
500.0000 mL | Freq: Once | INTRAVENOUS | Status: AC
  Administered 2014-01-10: 500 mL via INTRAVENOUS

## 2014-01-10 MED ORDER — MORPHINE SULFATE 4 MG/ML IJ SOLN
4.0000 mg | Freq: Once | INTRAMUSCULAR | Status: AC
  Administered 2014-01-10: 4 mg via INTRAVENOUS
  Filled 2014-01-10: qty 1

## 2014-01-10 MED ORDER — HYDROCODONE-ACETAMINOPHEN 5-325 MG PO TABS: 1.0000 | ORAL_TABLET | Freq: Four times a day (QID) | ORAL | Status: DC | PRN

## 2014-01-10 MED ORDER — ONDANSETRON HCL 4 MG/2ML IJ SOLN
4.0000 mg | Freq: Once | INTRAMUSCULAR | Status: AC
  Administered 2014-01-10: 4 mg via INTRAVENOUS
  Filled 2014-01-10: qty 2

## 2014-01-10 MED ORDER — ONDANSETRON HCL 4 MG PO TABS: 4.0000 mg | ORAL_TABLET | Freq: Four times a day (QID) | ORAL | Status: DC

## 2014-01-10 NOTE — ED Notes (Signed)
Pt called out c/o SOB and severe pain. Pt speaking in complete sentences and O2 at 100%. Pt moaning in bed, except when speaking. Pt A&Ox4. Pt HR 65.

## 2014-01-10 NOTE — ED Provider Notes (Signed)
CSN: [id number]     Arrival date & time 01/10/14  1528 History  This chart was scribed for non-physician practitioner, Dierdre ForthHannah Martie Fulgham, PA-C,working with Raeford RazorStephen Kohut, MD, by Karle PlumberJennifer Tensley, ED Scribe.  This patient was seen in room WTR6/WTR6 and the patient's care was started at 4:01 PM.  Chief Complaint  Patient presents with  . Shoulder Pain    sharp pain in r/shoulder x 3 hrs  . Nausea   The history is provided by the patient. No language interpreter was used.   HPI Comments:  [name] is a 33 y.o. female with h/o anxiety attacks, GERD, asthma, and PTSD who presents to the Emergency Department complaining of worsening waxing and waning severe sharp, stabbing right shoulder pain under her shoulder blade that started yesterday. She also reports frequent belching for the past four hours. Pt reports associated nausea, vomiting, and diarrhea that occurred yesterday as well. Pt states she worked last night and experienced nausea then also. She reports tingling in bilateral hands. She denies any trauma, fall, or injury. She denies abdominal pain, CP or hematemesis. She denies alcohol abuse or drug use. She states she has not eaten today and does not have an appetite..  Past Medical History  Diagnosis Date  . Panic attack   . PTSD (post-traumatic stress disorder)   . Anxiety   . GERD (gastroesophageal reflux disease)    Past Surgical History  Procedure Laterality Date  . Tonsillectomy    . Adenoidectomy    . Cesarean section    . Lymph node disection    . Reduction of turbinates    . Myringotomy     Family History  Problem Relation Age of Onset  . Hepatitis C Mother   . Hepatitis C Father   . Diabetes Other   . Hypertension Other   . Arthritis Other    History  Substance Use Topics  . Smoking status: Former Games developermoker  . Smokeless tobacco: Not on file  . Alcohol Use: No   OB History   Grav Para Term Preterm Abortions TAB SAB Ect Mult Living                  Review of Systems  Constitutional: Negative for fever, diaphoresis, appetite change, fatigue and unexpected weight change.  HENT: Negative for mouth sores.   Eyes: Negative for visual disturbance.  Respiratory: Negative for cough, chest tightness, shortness of breath and wheezing.   Cardiovascular: Negative for chest pain.  Gastrointestinal: Positive for nausea, vomiting and diarrhea. Negative for abdominal pain and constipation.  Endocrine: Negative for polydipsia, polyphagia and polyuria.  Genitourinary: Negative for dysuria, urgency, frequency and hematuria.  Musculoskeletal: Positive for arthralgias (right shoudler). Negative for back pain and neck stiffness.  Skin: Negative for rash.  Allergic/Immunologic: Negative for immunocompromised state.  Neurological: Negative for syncope, light-headedness and headaches.  Hematological: Does not bruise/bleed easily.  Psychiatric/Behavioral: Negative for sleep disturbance. The patient is not nervous/anxious.   All other systems reviewed and are negative.   Allergies  Penicillins; Monistat; Neosporin; and Betadine  Home Medications   Prior to Admission medications   Medication Sig Start Date End Date Taking? Authorizing Provider  cetirizine (ZYRTEC) 10 MG tablet Take 10 mg by mouth at bedtime.    Historical Provider, MD  HYDROcodone-acetaminophen (NORCO/VICODIN) 5-325 MG per tablet Take 1 tablet by mouth every 4 (four) hours as needed. 11/06/13   Antony MaduraKelly Humes, PA-C  levonorgestrel (MIRENA) 20 MCG/24HR IUD 1 each by Intrauterine route once.  Historical Provider, MD  naproxen (NAPROSYN) 500 MG tablet Take 1 tablet (500 mg total) by mouth 2 (two) times daily. 11/06/13   Antony MaduraKelly Humes, PA-C  traMADol (ULTRAM) 50 MG tablet Take 50 mg by mouth every 6 (six) hours as needed (pain).    Historical Provider, MD   Triage Vitals: BP 152/92  Pulse 91  Temp(Src) 98.8 F (37.1 C) (Oral)  Resp 22  SpO2 100%  LMP 01/06/2014 Physical Exam  Nursing  note and vitals reviewed. Constitutional: She appears well-developed and well-nourished. No distress.  Awake, alert, nontoxic appearance  HENT:  Head: Normocephalic and atraumatic.  Mouth/Throat: Oropharynx is clear and moist. No oropharyngeal exudate.  Eyes: Conjunctivae are normal. No scleral icterus.  Neck: Normal range of motion. Neck supple.  Cardiovascular: Normal rate, regular rhythm, normal heart sounds and intact distal pulses.   No murmur heard. Pulmonary/Chest: Effort normal and breath sounds normal. No respiratory distress. She has no wheezes.  Abdominal: Soft. Bowel sounds are normal. She exhibits no mass. There is tenderness (RUQ). There is no rebound and no guarding.  Musculoskeletal: Normal range of motion. She exhibits no edema.  Full ROM of right shoulder without increased pain. No pain to palpation of right trapezius or right scapula.  Neurological: She is alert.  Speech is clear and goal oriented Moves extremities without ataxia  Skin: Skin is warm and dry. She is not diaphoretic.    ED Course  Procedures (including critical care time) DIAGNOSTIC STUDIES: Oxygen Saturation is 100% on RA, normal by my interpretation.   COORDINATION OF CARE: 4:09 PM- Will move to back for further evaluation. Pt verbalizes understanding and agrees to plan.  Medications  sodium chloride 0.9 % bolus 500 mL (not administered)  ondansetron (ZOFRAN) injection 4 mg (not administered)      MDM   Final diagnoses:  None   [name] presents with Right shoulder pain, ? Hx of gallstones and associated N/V.  RUQ abd pain on exam.  No increased pain with ROM or palpation of the shoulder.  Pt to be moved to the acute side for completion of the work-up.  MSE was initiated and I personally evaluated the patient and placed orders (if any) at  4:23 PM on January 10, 2014.  The patient appears stable so that the remainder of the MSE may be completed by another provider.  BP 152/92   Pulse 91  Temp(Src) 98.8 F (37.1 C) (Oral)  Resp 22  SpO2 100%  LMP 01/06/2014  I personally performed the services described in this documentation, which was scribed in my presence. The recorded information has been reviewed and is accurate.    Dierdre ForthHannah Larsen Dungan, PA-C 01/10/14 1623  Lujean Ebright, PA-C 01/10/14 1624

## 2014-01-10 NOTE — ED Notes (Signed)
Patient transported to X-ray 

## 2014-01-10 NOTE — ED Notes (Signed)
US at bedside

## 2014-01-10 NOTE — ED Provider Notes (Signed)
CSN: [id number]     Arrival date & time 01/10/14  1528 History   First MD Initiated Contact with Patient 01/10/14 1544     Chief Complaint  Patient presents with  . Shoulder Pain    sharp pain in r/shoulder x 3 hrs  . Nausea     (Consider location/radiation/quality/duration/timing/severity/associated sxs/prior Treatment) HPI Comments: Patient presents today with a chief complaint of right shoulder pain.  She reports that the pain came on suddenly today around 12:30 PM.  She reports that the pain has been coming in spasms.  She denies any association with eating.  She states that she has had some nausea associated with the pain.  She denies acute injury or trauma to the shoulder.  She reports that she also had similar pain yesterday that was associated with vomiting at that time.  No vomiting today.  She denies abdominal pain, chest pain, hematemesis, fever, or chills.  She was given Morphine IV for pain by the previous provider, which she reports has helped her pain.  She reports that she had similar pain in 2010, which was thought to be Gallstones.  She denies chest pain, SOB, numbness, or tingling.  The history is provided by the patient.    Past Medical History  Diagnosis Date  . Panic attack   . PTSD (post-traumatic stress disorder)   . Anxiety   . GERD (gastroesophageal reflux disease)    Past Surgical History  Procedure Laterality Date  . Tonsillectomy    . Adenoidectomy    . Cesarean section    . Lymph node disection    . Reduction of turbinates    . Myringotomy     Family History  Problem Relation Age of Onset  . Hepatitis C Mother   . Hepatitis C Father   . Diabetes Other   . Hypertension Other   . Arthritis Other    History  Substance Use Topics  . Smoking status: Former Games developermoker  . Smokeless tobacco: Not on file  . Alcohol Use: No   OB History   Grav Para Term Preterm Abortions TAB SAB Ect Mult Living                 Review of Systems  All other systems  reviewed and are negative.     Allergies  Penicillins; Monistat; Neosporin; and Betadine  Home Medications   Prior to Admission medications   Medication Sig Start Date End Date Taking? Authorizing Provider  cetirizine (ZYRTEC) 10 MG tablet Take 10 mg by mouth at bedtime.    Historical Provider, MD  HYDROcodone-acetaminophen (NORCO/VICODIN) 5-325 MG per tablet Take 1 tablet by mouth every 4 (four) hours as needed. 11/06/13   Antony MaduraKelly Humes, PA-C  levonorgestrel (MIRENA) 20 MCG/24HR IUD 1 each by Intrauterine route once.    Historical Provider, MD  naproxen (NAPROSYN) 500 MG tablet Take 1 tablet (500 mg total) by mouth 2 (two) times daily. 11/06/13   Antony MaduraKelly Humes, PA-C  traMADol (ULTRAM) 50 MG tablet Take 50 mg by mouth every 6 (six) hours as needed (pain).    Historical Provider, MD   BP 152/92  Pulse 91  Temp(Src) 98.8 F (37.1 C) (Oral)  Resp 22  SpO2 100%  LMP 01/06/2014 Physical Exam  Nursing note and vitals reviewed. Constitutional: She appears well-developed and well-nourished.  HENT:  Head: Normocephalic and atraumatic.  Mouth/Throat: Oropharynx is clear and moist.  Neck: Normal range of motion. Neck supple.  Cardiovascular: Normal rate, regular rhythm and normal  heart sounds.   Pulmonary/Chest: Effort normal and breath sounds normal.  Abdominal: Soft. Bowel sounds are normal. She exhibits no distension and no mass. There is tenderness in the right upper quadrant. There is positive Murphy's sign. There is no rebound and no guarding.  Neurological: She is alert.  Skin: Skin is warm and dry.  Psychiatric: She has a normal mood and affect.    ED Course  Procedures (including critical care time) Labs Review Labs Reviewed  CBC WITH DIFFERENTIAL  COMPREHENSIVE METABOLIC PANEL  URINALYSIS, ROUTINE W REFLEX MICROSCOPIC  LIPASE, BLOOD  POC URINE PREG, ED    Imaging Review No results found.   EKG Interpretation None     7:20 PM Reassessed patient.  She reports that  her pain is tolerable.  Mild tenderness to palpation of the RUQ.  No rebound or guarding.   MDM   Final diagnoses:  None   Patient presenting today with right shoulder pain.  On exam patient found to have RUQ abdominal pain.  Labs unremarkable.  Abdominal ultrasound negative.  CXR negative.  Pain and nausea controlled prior to discharge.  Feel that the patient is stable for discharge.  Patient given referral to GI. Return precautions given.    Santiago GladHeather Laisure, PA-C 01/13/14 [phone]

## 2014-01-10 NOTE — ED Notes (Signed)
Let pt know that a urine sample was needed from her and she stated that she just went to the restroom but will let us know when she can go again.

## 2014-01-10 NOTE — ED Notes (Addendum)
Pt reports severe r/shoulder pain, shortness of breath, belching x 3 hours. NVD over last few days. Worked last night and reports nausea while at work. Pt is anxious and concerned about tingling sensation in hands. Reports hx panic attacks. Pt continues to "burp" while talking

## 2014-01-11 NOTE — ED Provider Notes (Signed)
Medical screening examination/treatment/procedure(s) were performed by non-physician practitioner and as supervising physician I was immediately available for consultation/collaboration.   EKG Interpretation None       Stephen Kohut, MD 01/11/14 0803 

## 2014-01-13 NOTE — ED Provider Notes (Signed)
Medical screening examination/treatment/procedure(s) were performed by non-physician practitioner and as supervising physician I was immediately available for consultation/collaboration.   EKG Interpretation   Date/Time:  Thursday January 10 2014 16:42:03 EDT Ventricular Rate:  81 PR Interval:  130 QRS Duration: 77 QT Interval:  566 QTC Calculation: 657 R Axis:   72 Text Interpretation:  Sinus arrhythmia Borderline T abnormalities,  inferior leads Prolonged QT interval ED PHYSICIAN INTERPRETATION AVAILABLE  IN [facility] Confirmed by TEST, Record ([id number]) on 01/12/2014 11:00:57  AM        Candyce ChurnJohn David Wofford III, MD 01/13/14 2050

## 2014-05-29 ENCOUNTER — Telehealth: Payer: Self-pay | Admitting: Radiology

## 2014-05-30 NOTE — Telephone Encounter (Signed)
Error, was given wrong patient name.

## 2014-10-30 ENCOUNTER — Emergency Department (HOSPITAL_COMMUNITY)
Admission: EM | Admit: 2014-10-30 | Discharge: 2014-10-30 | Disposition: A | Discharge status: Home / Self Care | Payer: Medicaid Other | Attending: Emergency Medicine | Admitting: Emergency Medicine

## 2014-10-30 ENCOUNTER — Encounter (HOSPITAL_COMMUNITY): Payer: Self-pay | Admitting: Emergency Medicine

## 2014-10-30 DIAGNOSIS — Z87891 Personal history of nicotine dependence: Secondary | ICD-10-CM | POA: Insufficient documentation

## 2014-10-30 DIAGNOSIS — Z8659 Personal history of other mental and behavioral disorders: Secondary | ICD-10-CM | POA: Insufficient documentation

## 2014-10-30 DIAGNOSIS — Z79899 Other long term (current) drug therapy: Secondary | ICD-10-CM | POA: Insufficient documentation

## 2014-10-30 DIAGNOSIS — R21 Rash and other nonspecific skin eruption: Secondary | ICD-10-CM

## 2014-10-30 DIAGNOSIS — Z8719 Personal history of other diseases of the digestive system: Secondary | ICD-10-CM | POA: Insufficient documentation

## 2014-10-30 DIAGNOSIS — Z880 Allergy status to penicillin: Secondary | ICD-10-CM | POA: Insufficient documentation

## 2014-10-30 MED ORDER — DOXYCYCLINE HYCLATE 100 MG PO CAPS: 100.0000 mg | ORAL_CAPSULE | Freq: Two times a day (BID) | ORAL | Status: DC

## 2014-10-30 NOTE — ED Notes (Addendum)
Pt A+Ox4, reports red/itchy rash to bilat hands since yesterday.  Mild pain with movement of fingers "because they're swollen".  Relieved with benadryl.  Pt denies rash elsewhere.  Skin otherwise PWD.  MAEI.  Speaking full/clear sentences.  NAD.

## 2014-10-30 NOTE — ED Provider Notes (Signed)
CSN: [id number]     Arrival date & time 10/30/14  1400 History  This chart was scribed for non-physician practitioner, Teressa Lower, NP, working with Jerelyn Scott, MD, by Lionel December, ED Scribe. This patient was seen in room WTR9/WTR9 and the patient's care was started at 2:09 PM.   First MD Initiated Contact with Patient 10/30/14 1404     Chief Complaint  Patient presents with  . Rash    red, itchy spots to bilat hands, relieved with benadryl     (Consider location/radiation/quality/duration/timing/severity/associated sxs/prior Treatment) Patient is a 34 y.o. female presenting with rash. The history is provided by the patient. No language interpreter was used.  Rash Associated symptoms: no fever    HPI Comments: [name] is a 34 y.o. female who presents to the Emergency Department complaining of a rash on the palm of her hands bilaterally onset last night with associated symptoms of pain when bending her hands due to swelling along with itching. Patient notes that she took benadryl to alleviate her symptoms but denies any relief.  Patient denies fever/chills. Patient is a CNA.  She has no other complaints today.   Past Medical History  Diagnosis Date  . Panic attack   . PTSD (post-traumatic stress disorder)   . Anxiety   . GERD (gastroesophageal reflux disease)    Past Surgical History  Procedure Laterality Date  . Tonsillectomy    . Adenoidectomy    . Cesarean section    . Lymph node disection    . Reduction of turbinates    . Myringotomy     Family History  Problem Relation Age of Onset  . Hepatitis C Mother   . Hepatitis C Father   . Diabetes Other   . Hypertension Other   . Arthritis Other    History  Substance Use Topics  . Smoking status: Former Games developer  . Smokeless tobacco: Not on file  . Alcohol Use: No   OB History    No data available     Review of Systems  Constitutional: Negative for fever and chills.  HENT: Negative for drooling.    Eyes: Negative for discharge.  Skin: Positive for rash.      Allergies  Penicillins; Monistat; Neosporin; and Betadine  Home Medications   Prior to Admission medications   Medication Sig Start Date End Date Taking? Authorizing Provider  albuterol (PROVENTIL HFA;VENTOLIN HFA) 108 (90 BASE) MCG/ACT inhaler Inhale 1 puff into the lungs every 6 (six) hours as needed for wheezing or shortness of breath.    Historical Provider, MD  cyclobenzaprine (FLEXERIL) 10 MG tablet Take 10 mg by mouth 3 (three) times daily as needed for muscle spasms.    Historical Provider, MD  HYDROcodone-acetaminophen (NORCO/VICODIN) 5-325 MG per tablet Take 1-2 tablets by mouth every 6 (six) hours as needed. 01/10/14   Santiago Glad, PA-C  levonorgestrel (MIRENA) 20 MCG/24HR IUD 1 each by Intrauterine route once.    Historical Provider, MD  naproxen sodium (ANAPROX) 220 MG tablet Take 440 mg by mouth as needed (for pain).    Historical Provider, MD  ondansetron (ZOFRAN) 4 MG tablet Take 1 tablet (4 mg total) by mouth every 6 (six) hours. 01/10/14   Heather Laisure, PA-C   BP 124/81 mmHg  Pulse 83  Temp(Src) 98.5 F (36.9 C) (Oral)  Resp 20  Ht  (1.626 m)  Wt 195 lb (88.451 kg)  BMI 33.46 kg/m2  SpO2 99% Physical Exam  Constitutional: She is oriented  to person, place, and time. She appears well-developed and well-nourished. No distress.  HENT:  Head: Normocephalic and atraumatic.  Right Ear: External ear normal.  Left Ear: External ear normal.  Mouth/Throat: Oropharynx is clear and moist.  Eyes: Conjunctivae and EOM are normal.  Neck: Neck supple.  Cardiovascular: Normal rate.   Pulmonary/Chest: Effort normal. No respiratory distress.  Musculoskeletal: Normal range of motion.  Neurological: She is alert and oriented to person, place, and time.  Skin:  Red papules to palms and joint of fingers  Psychiatric: She has a normal mood and affect. Her behavior is normal.  Nursing note and vitals  reviewed.   ED Course  Procedures (including critical care time) DIAGNOSTIC STUDIES: Oxygen Saturation is 99% on RA, normal by my interpretation.    COORDINATION OF CARE: 2:13 PM Discussed treatment plan with patient at beside, the patient agrees with the plan and has no further questions at this time.   Labs Review Labs Reviewed  RPR  HIV ANTIBODY (ROUTINE TESTING)    Imaging Review No results found.   EKG Interpretation None      MDM   Final diagnoses:  Rash   Discussed with pt that feel link it may be syphilis. Pt denies any discharge. Discussed she can be [facility] the [facility]. Pt has pcn allergy will treat with doxy  I personally performed the services described in this documentation, which was scribed in my presence. The recorded information has been reviewed and is accurate.     Teressa LowerVrinda Avigdor Dollar, NP 10/30/14 1437  Jerelyn ScottMartha Linker, MD 10/30/14 1504

## 2014-10-30 NOTE — Discharge Instructions (Signed)

## 2014-10-31 LAB — HIV ANTIBODY (ROUTINE TESTING W REFLEX): HIV Screen 4th Generation wRfx: NONREACTIVE

## 2014-10-31 LAB — RPR: RPR: NONREACTIVE

## 2014-11-04 ENCOUNTER — Encounter (HOSPITAL_COMMUNITY): Payer: Self-pay | Admitting: *Deleted

## 2014-11-04 ENCOUNTER — Telehealth (HOSPITAL_COMMUNITY): Payer: Self-pay

## 2014-11-04 ENCOUNTER — Emergency Department (HOSPITAL_COMMUNITY)
Admission: EM | Admit: 2014-11-04 | Discharge: 2014-11-04 | Disposition: A | Discharge status: Home / Self Care | Payer: Medicaid Other | Attending: Emergency Medicine | Admitting: Emergency Medicine

## 2014-11-04 DIAGNOSIS — Z880 Allergy status to penicillin: Secondary | ICD-10-CM | POA: Insufficient documentation

## 2014-11-04 DIAGNOSIS — A64 Unspecified sexually transmitted disease: Secondary | ICD-10-CM | POA: Insufficient documentation

## 2014-11-04 DIAGNOSIS — Z8719 Personal history of other diseases of the digestive system: Secondary | ICD-10-CM | POA: Insufficient documentation

## 2014-11-04 DIAGNOSIS — Z87891 Personal history of nicotine dependence: Secondary | ICD-10-CM | POA: Insufficient documentation

## 2014-11-04 DIAGNOSIS — Z8659 Personal history of other mental and behavioral disorders: Secondary | ICD-10-CM | POA: Insufficient documentation

## 2014-11-04 DIAGNOSIS — Z79899 Other long term (current) drug therapy: Secondary | ICD-10-CM | POA: Insufficient documentation

## 2014-11-04 DIAGNOSIS — R21 Rash and other nonspecific skin eruption: Secondary | ICD-10-CM

## 2014-11-04 NOTE — ED Notes (Signed)
Called for lab results. States she is getting worse. Advised to return for re-evaluation.

## 2014-11-04 NOTE — ED Provider Notes (Signed)
CSN: [id number]     Arrival date & time 11/04/14  0912 History   First MD Initiated Contact with Patient 11/04/14 [phone]     Chief Complaint  Patient presents with  . Rash  . SEXUALLY TRANSMITTED DISEASE     (Consider location/radiation/quality/duration/timing/severity/associated sxs/prior Treatment) HPI Comments: Patient presents today with a rash.  Rash located on both hands and has been present for the past 6 days.  She was seen in the ED five days ago.  At that time the provider that saw her thought possibly Syphilis.  RPR was ordered and she was given a Rx for Doxycycline.  RPR actually came back negative.  She reports that the rash is pruritic and also slightly painful.  She denies ever having a rash like this before.  She denies new medications aside from the Doxycycline.  No new soaps, detergents, or lotions.  Denies fever, chills, abdominal pain, nausea, vomiting, or any oral lesions.  No contacts with similar rash.  Patient is a 34 y.o. female presenting with rash. The history is provided by the patient.  Rash   Past Medical History  Diagnosis Date  . Panic attack   . PTSD (post-traumatic stress disorder)   . Anxiety   . GERD (gastroesophageal reflux disease)    Past Surgical History  Procedure Laterality Date  . Tonsillectomy    . Adenoidectomy    . Cesarean section    . Lymph node disection    . Reduction of turbinates    . Myringotomy     Family History  Problem Relation Age of Onset  . Hepatitis C Mother   . Hepatitis C Father   . Diabetes Other   . Hypertension Other   . Arthritis Other    History  Substance Use Topics  . Smoking status: Former Games developermoker  . Smokeless tobacco: Not on file  . Alcohol Use: No   OB History    No data available     Review of Systems  Skin: Positive for rash.  All other systems reviewed and are negative.     Allergies  Penicillins; Monistat; Neosporin; and Betadine  Home Medications   Prior to Admission medications    Medication Sig Start Date End Date Taking? Authorizing Provider  acetaminophen (TYLENOL) 500 MG tablet Take 1,000 mg by mouth every 6 (six) hours as needed for mild pain or headache.   Yes Historical Provider, MD  albuterol (PROVENTIL HFA;VENTOLIN HFA) 108 (90 BASE) MCG/ACT inhaler Inhale 1 puff into the lungs every 6 (six) hours as needed for wheezing or shortness of breath.   Yes Historical Provider, MD  doxycycline (VIBRAMYCIN) 100 MG capsule Take 1 capsule (100 mg total) by mouth 2 (two) times daily. 10/30/14  Yes Teressa LowerVrinda Pickering, NP  levonorgestrel (MIRENA) 20 MCG/24HR IUD 1 each by Intrauterine route once.   Yes Historical Provider, MD  HYDROcodone-acetaminophen (NORCO/VICODIN) 5-325 MG per tablet Take 1-2 tablets by mouth every 6 (six) hours as needed. 01/10/14   Raza Bayless, PA-C  ondansetron (ZOFRAN) 4 MG tablet Take 1 tablet (4 mg total) by mouth every 6 (six) hours. 01/10/14   Alysandra Lobue, PA-C   BP 135/87 mmHg  Pulse 91  Temp(Src) 98.1 F (36.7 C) (Oral)  Resp 16  SpO2 100%  LMP 10/18/2014 Physical Exam  Constitutional: She appears well-developed and well-nourished.  HENT:  Head: Normocephalic and atraumatic.  Mouth/Throat: Oropharynx is clear and moist.  Neck: Normal range of motion. Neck supple.  Cardiovascular: Normal rate, regular rhythm  and normal heart sounds.   No murmur heard. Pulmonary/Chest: Effort normal and breath sounds normal.  Musculoskeletal: Normal range of motion.  Neurological: She is alert.  Skin: Skin is warm and dry.  Erythematous circular papules located on the the DIP of the right index finger, palmar surface of all fingers of the right hand, and also the palmar surface of the left 2nd and 3rd fingers  Psychiatric: She has a normal mood and affect.  Nursing note and vitals reviewed.   ED Course  Procedures (including critical care time) Labs Review Labs Reviewed - No data to display  Imaging Review No results found.   EKG  Interpretation None      MDM   Final diagnoses:  None   Patient presents with a rash of the hands bilaterally.  RPR done last week was negative.  Patient afebrile and nontoxic appearing.  Patient also evaluated by Dr. Jodi Mourning.  Patient is stable for discharge.  Return precautions given.     Santiago Glad, PA-C 11/05/14 2152  Blane Ohara, MD 11/08/14 [phone]

## 2014-11-04 NOTE — Discharge Instructions (Signed)
Continue taking the Doxycycline.

## 2014-11-04 NOTE — ED Notes (Signed)
Pt reports she was seen and treated on 4/6, told she had syphilis and was started on doxycicline. Reports results came back negative, pt still taking abx. Pt reports rash on palms is spreading and axilla lymph nodes are swollen. Hand rash pain 6/10.

## 2014-11-07 ENCOUNTER — Telehealth (HOSPITAL_BASED_OUTPATIENT_CLINIC_OR_DEPARTMENT_OTHER): Payer: Self-pay | Admitting: Emergency Medicine

## 2016-01-30 ENCOUNTER — Telehealth (HOSPITAL_COMMUNITY): Payer: Self-pay | Admitting: *Deleted

## 2016-01-30 ENCOUNTER — Encounter (HOSPITAL_COMMUNITY): Payer: Self-pay | Admitting: *Deleted

## 2016-01-30 NOTE — Telephone Encounter (Signed)
Telephoned patient at home # and left message to return call to [facility] 

## 2016-02-02 ENCOUNTER — Other Ambulatory Visit (HOSPITAL_COMMUNITY): Payer: Self-pay | Admitting: *Deleted

## 2016-02-02 DIAGNOSIS — N644 Mastodynia: Secondary | ICD-10-CM

## 2016-02-26 ENCOUNTER — Encounter (HOSPITAL_COMMUNITY): Payer: Self-pay

## 2016-02-26 ENCOUNTER — Ambulatory Visit (HOSPITAL_COMMUNITY)
Admission: RE | Admit: 2016-02-26 | Discharge: 2016-02-26 | Disposition: A | Discharge status: Home / Self Care | Payer: Self-pay | Source: Ambulatory Visit | Attending: Obstetrics and Gynecology | Admitting: Obstetrics and Gynecology

## 2016-02-26 ENCOUNTER — Ambulatory Visit
Admission: RE | Admit: 2016-02-26 | Discharge: 2016-02-26 | Disposition: A | Discharge status: Home / Self Care | Payer: No Typology Code available for payment source | Source: Ambulatory Visit | Attending: Obstetrics and Gynecology | Admitting: Obstetrics and Gynecology

## 2016-02-26 VITALS — BP 120/82 | Temp 98.6°F | Ht 64.0 in | Wt 162.4 lb

## 2016-02-26 DIAGNOSIS — N644 Mastodynia: Secondary | ICD-10-CM

## 2016-02-26 DIAGNOSIS — Z1239 Encounter for other screening for malignant neoplasm of breast: Secondary | ICD-10-CM

## 2016-02-26 DIAGNOSIS — N6320 Unspecified lump in the left breast, unspecified quadrant: Secondary | ICD-10-CM

## 2016-02-26 DIAGNOSIS — N6325 Unspecified lump in the left breast, overlapping quadrants: Secondary | ICD-10-CM

## 2016-02-26 NOTE — Progress Notes (Signed)
Complaints of left breast lump x 1.5 years that has increased in size that is painful at times. Patient rated pain at a 7 out of 10.  Pap Smear: Pap smear not completed today. Last Pap smear was 12/17/2015 at the [facility] and normal. Per patient has a history of an abnormal Pap smear in 2008 that a repeat Pap smear was completed for follow up. Patient states all Pap smears have been normal since. Last Pap smear result is in EPIC.  Physical exam: Breasts Breasts symmetrical. No skin abnormalities bilateral breasts. No nipple retraction bilateral breasts. No nipple discharge bilateral breasts. No lymphadenopathy. No lumps palpated right breast. Palpated a mobile left breast lump at 3 o'clock next the areola. No complaints of pain or tenderness on exam. Referred patient to the [facility] for diagnostic mammogram and possible left breast ultrasound. Appointment scheduled for Thursday, February 26, 2016 at 1330.        Pelvic/Bimanual No Pap smear completed today since last Pap smear was 12/17/2015. Pap smear not indicated per [facility] guidelines.   Smoking History: Patient has never smoked.  Patient Navigation: Patient education provided. Access to services provided for patient through [facility] program.

## 2016-02-26 NOTE — Patient Instructions (Addendum)
Explained self breast awareness to [employer]. Let patient know [facility] will cover Pap smears every 3 years unless has a history of abnormal Pap smears and that she didn't need a Pap smear today due to her last pap smear was 12/17/2015. Referred patient to the [facility] for diagnostic mammogram and possible left breast ultrasound. Appointment scheduled for Thursday, February 26, 2016 at 1330. [name] verbalized understanding.  [name], Kathaleen Maser, RN 3:18 PM

## 2016-02-26 NOTE — Addendum Note (Signed)
Encounter addended by: Priscille Heidelberg, RN on: 02/26/2016  3:20 PM<BR>    Actions taken: Sign clinical note

## 2016-03-01 ENCOUNTER — Encounter (HOSPITAL_COMMUNITY): Payer: Self-pay | Admitting: *Deleted

## 2016-03-11 ENCOUNTER — Other Ambulatory Visit: Payer: Self-pay | Admitting: Surgery

## 2016-03-11 ENCOUNTER — Telehealth (HOSPITAL_COMMUNITY): Payer: Self-pay | Admitting: *Deleted

## 2016-03-11 NOTE — Telephone Encounter (Signed)
[facility] today for a surgical consult. The surgical consult was requested by the patient but not recommended. Patient was angry when told [facility] was [facility]. Clydie BraunKaren from [facility] called the Breast Center and left message with nurse. Called Clydie BraunKaren back and talked with patient. Explained to patient that it is not covered due to it was her request and not recommended based on standard of care. Explained to patient that [facility] is [employer] and [employer] funded and that are unable to cover through [facility] if not recommended based on standard of care. Apologized to patient that she was not informed prior to scheduling appointment. Patient verbalized understanding.

## 2016-07-03 ENCOUNTER — Emergency Department (HOSPITAL_BASED_OUTPATIENT_CLINIC_OR_DEPARTMENT_OTHER)
Admission: EM | Admit: 2016-07-03 | Discharge: 2016-07-03 | Disposition: A | Discharge status: Home / Self Care | Payer: No Typology Code available for payment source | Attending: Emergency Medicine | Admitting: Emergency Medicine

## 2016-07-03 ENCOUNTER — Encounter (HOSPITAL_BASED_OUTPATIENT_CLINIC_OR_DEPARTMENT_OTHER): Payer: Self-pay | Admitting: *Deleted

## 2016-07-03 DIAGNOSIS — J45909 Unspecified asthma, uncomplicated: Secondary | ICD-10-CM | POA: Insufficient documentation

## 2016-07-03 DIAGNOSIS — F17200 Nicotine dependence, unspecified, uncomplicated: Secondary | ICD-10-CM | POA: Insufficient documentation

## 2016-07-03 DIAGNOSIS — B9789 Other viral agents as the cause of diseases classified elsewhere: Secondary | ICD-10-CM

## 2016-07-03 DIAGNOSIS — J069 Acute upper respiratory infection, unspecified: Secondary | ICD-10-CM

## 2016-07-03 DIAGNOSIS — R6889 Other general symptoms and signs: Secondary | ICD-10-CM

## 2016-07-03 DIAGNOSIS — Z79899 Other long term (current) drug therapy: Secondary | ICD-10-CM | POA: Insufficient documentation

## 2016-07-03 NOTE — ED Provider Notes (Signed)
[facility] Provider Note   CSN: [id number] Arrival date & time: 07/03/16  2048   By signing my name below, I, [name], attest that this documentation has been prepared under the direction and in the presence of Pricilla LovelessScott Hiliana Eilts, MD. Electronically Signed: Valentino SaxonBianca Oconnell, ED Scribe. 07/03/16. 9:28 PM.  History   Chief Complaint Chief Complaint  Patient presents with  . Sore Throat   The history is provided by the patient. No language interpreter was used.   HPI Comments: [name] with Hx of Asthma, is a 35 y.o. female who presents to the Emergency Department complaining of moderate, constant, generalized body aches accompanied with soreness onset earlier today. Pt states she was taking a nap before work, and noticed she was sweating and began having overall body soreness when she woke up. She notes having upper neck, back, arms and leg soreness. Pt also reports associated cough with sore throat accompanied by chills, congestion and HA. She notes her HA is worsened when laying down. Pt does note recent sick contact with a coworker's sibling.Pt states taking Zyrtec, that she uses for her allergies, with minimal relief. She does note having a rash that had spread across her chest, but has been relieved since. She denies getting a flu shot this year. She denies vomiting, diarrhea, SOB. No additional complaints at this time.   Past Medical History:  Diagnosis Date  . Anxiety   . GERD (gastroesophageal reflux disease)   . Panic attack   . PTSD (post-traumatic stress disorder)     Patient Active Problem List   Diagnosis Date Noted  . 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 OBESITY, NOS 09/22/2006  . DEPRESSION, MAJOR, RECURRENT 09/22/2006  . RHINITIS, ALLERGIC 09/22/2006  . 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 IRRITABLE BOWEL SYNDROME 09/22/2006    Past Surgical History:  Procedure Laterality Date  . ADENOIDECTOMY    . CESAREAN SECTION    . lymph node disection    .  MYRINGOTOMY    . reduction of turbinates    . TONSILLECTOMY      OB History    Gravida Para Term Preterm AB Living   5 4 4   1      SAB TAB Ectopic Multiple Live Births   1       4       Home Medications    Prior to Admission medications   Medication Sig Start Date End Date Taking? Authorizing Provider  acetaminophen (TYLENOL) 500 MG tablet Take 1,000 mg by mouth every 6 (six) hours as needed for mild pain or headache.    Historical Provider, MD  albuterol (PROVENTIL HFA;VENTOLIN HFA) 108 (90 BASE) MCG/ACT inhaler Inhale 1 puff into the lungs every 6 (six) hours as needed for wheezing or shortness of breath.    Historical Provider, MD  doxycycline (VIBRAMYCIN) 100 MG capsule Take 1 capsule (100 mg total) by mouth 2 (two) times daily. Patient not taking: Reported on 02/26/2016 10/30/14   Teressa LowerVrinda Pickering, NP  HYDROcodone-acetaminophen (NORCO/VICODIN) 5-325 MG per tablet Take 1-2 tablets by mouth every 6 (six) hours as needed. Patient not taking: Reported on 02/26/2016 01/10/14   Santiago GladHeather Laisure, PA-C  levonorgestrel (MIRENA) 20 MCG/24HR IUD 1 each by Intrauterine route once.    Historical Provider, MD  ondansetron (ZOFRAN) 4 MG tablet Take 1 tablet (4 mg total) by mouth every 6 (six) hours. Patient not taking: Reported on 02/26/2016 01/10/14   Santiago GladHeather Laisure, PA-C    Family History Family History  Problem Relation Age of Onset  . Hepatitis C Mother   . Hypertension Mother   . Diabetes Mother   . Hepatitis C Father   . Hypertension Father   . Diabetes Other   . Hypertension Other   . Arthritis Other     Social History Social History  Substance Use Topics  . Smoking status: Current Every Day Smoker    Last attempt to quit: 04/25/2013  . Smokeless tobacco: Former Neurosurgeon  . Alcohol use Yes     Comment: rarely     Allergies   Penicillins; Monistat [miconazole nitrate-wipes]; Neosporin [neomycin-bacitracin zn-polymyx]; and Betadine [povidone iodine]   Review of Systems Review of  Systems  Constitutional: Positive for chills.  HENT: Positive for congestion.   Respiratory: Positive for cough. Negative for shortness of breath.   Gastrointestinal: Negative for diarrhea and vomiting.  Musculoskeletal:       +generalized body soreness   Neurological: Positive for headaches.  All other systems reviewed and are negative.    Physical Exam Updated Vital Signs BP 125/73 (BP Location: Right Arm)   Pulse 93   Temp 99 F (37.2 C) (Oral)   Resp 18   Ht 5\' 4"  (1.626 m)   Wt 160 lb (72.6 kg)   LMP 06/15/2016 (Exact Date)   SpO2 100%   BMI 27.46 kg/m   Physical Exam  Constitutional: She is oriented to person, place, and time. She appears well-developed and well-nourished.  HENT:  Head: Normocephalic and atraumatic.  Right Ear: External ear normal.  Left Ear: External ear normal.  Nose: Nose normal.  Mouth/Throat: Oropharynx is clear and moist. No oropharyngeal exudate.  Eyes: Right eye exhibits no discharge. Left eye exhibits no discharge.  Neck: Normal range of motion. Neck supple.  Cardiovascular: Normal rate, regular rhythm and normal heart sounds.   Pulmonary/Chest: Effort normal and breath sounds normal.  Abdominal: Soft. There is no tenderness.  Lymphadenopathy:    She has no cervical adenopathy.  Neurological: She is alert and oriented to person, place, and time.  Skin: Skin is warm and dry.  Nursing note and vitals reviewed.    ED Treatments / Results   DIAGNOSTIC STUDIES: Oxygen Saturation is 100% on RA, normal by my interpretation.    COORDINATION OF CARE: 9:11 PM Discussed treatment plan with pt at bedside and pt agreed to plan.  Labs (all labs ordered are listed, but only abnormal results are displayed) Labs Reviewed - No data to display  EKG  EKG Interpretation None       Radiology No results found.  Procedures Procedures (including critical care time)  Medications Ordered in ED Medications - No data to display   Initial  Impression / Assessment and Plan / ED Course  I have reviewed the triage vital signs and the nursing notes.  Pertinent labs & imaging results that were available during my care of the patient were reviewed by me and considered in my medical decision making (see chart for details).  Clinical Course     Patient appears to have a flulike illness. She has mild asthma with very infrequent asthma exacerbations. I do not think she has significant lung pathology that would require Tamiflu administration. I discussed risks and benefits of Tamiflu and she would like to not have this. Overall does not appear to have a bacterial source of infection. She has sore throat but in association with other symptoms leading is more likely influenza related. There is no significant erythema or exudate. She  is currently afebrile. She declines Tylenol or ibuprofen here, we'll take it home. No shortness of breath or abnormal lung sounds suggest pneumonia. She has a mild headache and some neck pain but there is no stiffness or meningismus. I think this is all related to myalgias from her viral illness. Discussed strict return precautions and symptomatic care.  Final Clinical Impressions(s) / ED Diagnoses   Final diagnoses:  Viral URI with cough  Flu-like symptoms    New Prescriptions New Prescriptions   No medications on file    I personally performed the services described in this documentation, which was scribed in my presence. The recorded information has been reviewed and is accurate.      Pricilla LovelessScott Zayveon Raschke, MD 07/03/16 2142

## 2016-07-03 NOTE — ED Triage Notes (Addendum)
C/o new onset body aches (neck, back, shoulders), chills, nasal congestion, HA, dry cough, sore throat, a little dizziness, (denies: nvd, sob), tool zyrtec PTA.

## 2016-07-07 ENCOUNTER — Emergency Department (HOSPITAL_BASED_OUTPATIENT_CLINIC_OR_DEPARTMENT_OTHER)
Admission: EM | Admit: 2016-07-07 | Discharge: 2016-07-07 | Disposition: A | Discharge status: Home / Self Care | Payer: Self-pay | Attending: Emergency Medicine | Admitting: Emergency Medicine

## 2016-07-07 ENCOUNTER — Emergency Department (HOSPITAL_BASED_OUTPATIENT_CLINIC_OR_DEPARTMENT_OTHER): Payer: Self-pay

## 2016-07-07 ENCOUNTER — Encounter (HOSPITAL_BASED_OUTPATIENT_CLINIC_OR_DEPARTMENT_OTHER): Payer: Self-pay

## 2016-07-07 DIAGNOSIS — Z79899 Other long term (current) drug therapy: Secondary | ICD-10-CM | POA: Insufficient documentation

## 2016-07-07 DIAGNOSIS — J40 Bronchitis, not specified as acute or chronic: Secondary | ICD-10-CM | POA: Insufficient documentation

## 2016-07-07 DIAGNOSIS — F17200 Nicotine dependence, unspecified, uncomplicated: Secondary | ICD-10-CM | POA: Insufficient documentation

## 2016-07-07 HISTORY — DX: Unspecified asthma, uncomplicated: J45.909

## 2016-07-07 MED ORDER — BENZONATATE 100 MG PO CAPS: 100.0000 mg | ORAL_CAPSULE | Freq: Three times a day (TID) | ORAL | 0 refills | Status: DC

## 2016-07-07 MED ORDER — PREDNISONE 20 MG PO TABS: 40.0000 mg | ORAL_TABLET | Freq: Every day | ORAL | 0 refills | Status: DC

## 2016-07-07 NOTE — ED Notes (Signed)
ED Provider at bedside. 

## 2016-07-07 NOTE — ED Triage Notes (Addendum)
C/o cough x 5 days-seen here for same Saturday-NAD-steady gait-last tylenol 9am

## 2016-07-07 NOTE — ED Provider Notes (Signed)
[facility] Provider Note   CSN: [id number] Arrival date & time: 07/07/16  1212     History   Chief Complaint Chief Complaint  Patient presents with  . Cough    HPI [name] is a 35 y.o. female.  Patient with history of asthma presents to the emergency department with persistent cough. Patient was seen on 07/03/2016 with URI symptoms including cough, sore throat, muscle aches. It was suspected that she may have the flu or a URI. Since that time, her sore throat and muscle aches have improved. Major complaint currently is her cough. Cough is productive of yellow mucus. She has been drinking tea with honey with some relief. She continues to have some wheezing and is using her albuterol inhaler quite frequently. Typically she uses it once per week. Now she is using it several times per day. No current fevers. No nausea, vomiting, or diarrhea. No sick contacts. No other treatments. Onset of symptoms acute. Course is improving. Nothing makes symptoms worse.      Past Medical History:  Diagnosis Date  . Anxiety   . Asthma   . GERD (gastroesophageal reflux disease)   . Panic attack   . PTSD (post-traumatic stress disorder)     Patient Active Problem List   Diagnosis Date Noted  . 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 OBESITY, NOS 09/22/2006  . DEPRESSION, MAJOR, RECURRENT 09/22/2006  . RHINITIS, ALLERGIC 09/22/2006  . 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 IRRITABLE BOWEL SYNDROME 09/22/2006    Past Surgical History:  Procedure Laterality Date  . ADENOIDECTOMY    . CESAREAN SECTION    . lymph node disection    . MYRINGOTOMY    . reduction of turbinates    . TONSILLECTOMY      OB History    Gravida Para Term Preterm AB Living   5 4 4   1      SAB TAB Ectopic Multiple Live Births   1       4       Home Medications    Prior to Admission medications   Medication Sig Start Date End Date Taking? Authorizing Provider  acetaminophen (TYLENOL) 500 MG tablet Take  1,000 mg by mouth every 6 (six) hours as needed for mild pain or headache.    Historical Provider, MD  albuterol (PROVENTIL HFA;VENTOLIN HFA) 108 (90 BASE) MCG/ACT inhaler Inhale 1 puff into the lungs every 6 (six) hours as needed for wheezing or shortness of breath.    Historical Provider, MD  benzonatate (TESSALON) 100 MG capsule Take 1 capsule (100 mg total) by mouth every 8 (eight) hours. 07/07/16   Renne CriglerJoshua Shariah Assad, PA-C  levonorgestrel (MIRENA) 20 MCG/24HR IUD 1 each by Intrauterine route once.    Historical Provider, MD  predniSONE (DELTASONE) 20 MG tablet Take 2 tablets (40 mg total) by mouth daily. 07/07/16   Renne CriglerJoshua Jaquavian Firkus, PA-C    Family History Family History  Problem Relation Age of Onset  . Hepatitis C Mother   . Hypertension Mother   . Diabetes Mother   . Hepatitis C Father   . Hypertension Father   . Diabetes Other   . Hypertension Other   . Arthritis Other     Social History Social History  Substance Use Topics  . Smoking status: Current Every Day Smoker  . Smokeless tobacco: Former NeurosurgeonUser  . Alcohol use No     Allergies   Penicillins; Monistat [miconazole nitrate-wipes]; Neosporin [neomycin-bacitracin zn-polymyx]; and Betadine [povidone iodine]  Review of Systems Review of Systems  Constitutional: Negative for chills, fatigue and fever.  HENT: Positive for congestion. Negative for ear pain, rhinorrhea, sinus pressure and sore throat.   Eyes: Negative for redness.  Respiratory: Positive for cough and wheezing. Negative for chest tightness and shortness of breath.   Cardiovascular: Negative for chest pain.  Gastrointestinal: Negative for abdominal pain, diarrhea, nausea and vomiting.  Genitourinary: Negative for dysuria.  Musculoskeletal: Negative for myalgias and neck stiffness.  Skin: Negative for rash.  Neurological: Negative for headaches.  Hematological: Negative for adenopathy.     Physical Exam Updated Vital Signs BP 128/95 (BP Location: Right Arm)    Pulse 88   Temp 98.4 F (36.9 C) (Oral)   Resp 18   Ht 5\' 4"  (1.626 m)   Wt 71.7 kg   LMP 06/15/2016 (Exact Date)   SpO2 100%   BMI 27.12 kg/m   Physical Exam  Constitutional: She appears well-developed and well-nourished.  HENT:  Head: Normocephalic and atraumatic.  Right Ear: Tympanic membrane, external ear and ear canal normal.  Left Ear: Tympanic membrane, external ear and ear canal normal.  Nose: Mucosal edema present. No rhinorrhea.  Mouth/Throat: Uvula is midline, oropharynx is clear and moist and mucous membranes are normal. Mucous membranes are not dry. No oral lesions. No trismus in the jaw. No uvula swelling. No oropharyngeal exudate, posterior oropharyngeal edema, posterior oropharyngeal erythema or tonsillar abscesses.  Eyes: Conjunctivae are normal. Right eye exhibits no discharge. Left eye exhibits no discharge.  Neck: Normal range of motion. Neck supple.  Cardiovascular: Normal rate, regular rhythm and normal heart sounds.   Pulmonary/Chest: Effort normal and breath sounds normal. No respiratory distress. She has no wheezes. She has no rales.  Occasional cough during exam  Abdominal: Soft. There is no tenderness.  Lymphadenopathy:    She has no cervical adenopathy.  Neurological: She is alert.  Skin: Skin is warm and dry.  Psychiatric: She has a normal mood and affect.  Nursing note and vitals reviewed.    ED Treatments / Results   Radiology Dg Chest 2 View  Result Date: 07/07/2016 CLINICAL DATA:  Pt dx w/flu over weekend. Now having lt sided chest pain. EXAM: CHEST - 2 VIEW COMPARISON:  01/10/2014 FINDINGS: Lungs are clear. Heart size and mediastinal contours are within normal limits. No effusion. Visualized bones unremarkable. IMPRESSION: No acute cardiopulmonary disease. Electronically Signed   By: Corlis Leak M.D.   On: 07/07/2016 14:31    Procedures Procedures (including critical care time)   Initial Impression / Assessment and Plan / ED Course    I have reviewed the triage vital signs and the nursing notes.  Pertinent labs & imaging results that were available during my care of the patient were reviewed by me and considered in my medical decision making (see chart for details).  Clinical Course    Patient seen and examined. Given persistent symptoms, will check x-ray to rule out any signs of pneumonia.   Vital signs reviewed and are as follows: BP 128/95 (BP Location: Right Arm)   Pulse 88   Temp 98.4 F (36.9 C) (Oral)   Resp 18   Ht 5\' 4"  (1.626 m)   Wt 71.7 kg   LMP 06/15/2016 (Exact Date)   SpO2 100%   BMI 27.12 kg/m   Chest x-ray performed and is negative. Given increased use of albuterol inhaler, patient will be placed on a burst of prednisone as well as Tessalon. Hopefully these will help control  her symptoms until she is feeling better. Patient is encouraged to return to the emergency department with worsening chest pain, if she develops shortness of breath, has difficulty breathing, new symptoms or other concerns. She verbalizes understanding and agrees with plan.  Final Clinical Impressions(s) / ED Diagnoses   Final diagnoses:  Bronchitis   Patient with likely resolving upper respiratory infection or flulike illness. Now with persisting cough and likely continued mild exacerbation of her asthma. Chest x-ray is negative for pneumonia. Treatment as above. Normal vital signs with 100% oxygen saturation.   New Prescriptions Discharge Medication List as of 07/07/2016  2:46 PM    START taking these medications   Details  benzonatate (TESSALON) 100 MG capsule Take 1 capsule (100 mg total) by mouth every 8 (eight) hours., Starting Wed 07/07/2016, Print    predniSONE (DELTASONE) 20 MG tablet Take 2 tablets (40 mg total) by mouth daily., Starting Wed 07/07/2016, Print         Renne CriglerJoshua Kalvin Buss, PA-C 07/07/16 1619    Charlynne Panderavid Hsienta Yao, MD 07/08/16 [phone]

## 2016-07-07 NOTE — Discharge Instructions (Signed)
Please read and follow all provided instructions.  Your diagnoses today include:  1. Bronchitis     Tests performed today include:  Chest x-ray - does not show any pneumonia  Vital signs. See below for your results today.   Medications prescribed:   Prednisone - steroid medicine   It is best to take this medication in the morning to prevent sleeping problems. If you are diabetic, monitor your blood sugar closely and stop taking Prednisone if blood sugar is over 300. Take with food to prevent stomach upset.    Tessalon Perles - cough suppressant medication   Albuterol inhaler - medication that opens up your airway  Use your home inhaler as follows: 1-2 puffs every 4 hours as needed for wheezing, cough, or shortness of breath.   Take any prescribed medications only as directed.  Home care instructions:  Follow any educational materials contained in this packet.  Follow-up instructions: Please follow-up with your primary care provider in the next 3 days for further evaluation of your symptoms and a recheck if you are not feeling better.   Return instructions:   Please return to the Emergency Department if you experience worsening symptoms.  Please return with worsening wheezing, shortness of breath, or difficulty breathing.  Return with persistent fever above 101F.   Please return if you have any other emergent concerns.  Additional Information:  Your vital signs today were: BP 128/95 (BP Location: Right Arm)    Pulse 88    Temp 98.4 F (36.9 C) (Oral)    Resp 18    Ht 5\' 4"  (1.626 m)    Wt 71.7 kg    LMP 06/15/2016 (Exact Date)    SpO2 100%    BMI 27.12 kg/m  If your blood pressure (BP) was elevated above 135/85 this visit, please have this repeated by your doctor within one month. --------------

## 2017-03-22 ENCOUNTER — Ambulatory Visit: Payer: Self-pay | Admitting: Family Medicine

## 2017-03-26 ENCOUNTER — Ambulatory Visit (INDEPENDENT_AMBULATORY_CARE_PROVIDER_SITE_OTHER): Payer: No Typology Code available for payment source | Admitting: Emergency Medicine

## 2017-03-26 ENCOUNTER — Encounter: Payer: Self-pay | Admitting: Emergency Medicine

## 2017-03-26 VITALS — BP 127/76 | HR 64 | Temp 98.2°F | Resp 17 | Ht 63.5 in | Wt 179.1 lb

## 2017-03-26 DIAGNOSIS — Z0000 Encounter for general adult medical examination without abnormal findings: Secondary | ICD-10-CM

## 2017-03-26 DIAGNOSIS — Z23 Encounter for immunization: Secondary | ICD-10-CM | POA: Diagnosis not present

## 2017-03-26 NOTE — Patient Instructions (Addendum)
   IF you received an x-ray today, you will receive an invoice from [facility]. Please contact [facility] at [phone] with questions or concerns regarding your invoice.   IF you received labwork today, you will receive an invoice from [facility]. Please contact [facility] at [phone] with questions or concerns regarding your invoice.   Our billing staff will not be able to assist you with questions regarding bills from these companies.  You will be contacted with the lab results as soon as they are available. The fastest way to get your results is to activate your My Chart account. Instructions are located on the last page of this paperwork. If you have not heard from us regarding the results in 2 weeks, please contact this office.     Health Maintenance, Female Adopting a healthy lifestyle and getting preventive care can go a long way to promote health and wellness. Talk with your health care provider about what schedule of regular examinations is right for you. This is a good chance for you to check in with your provider about disease prevention and staying healthy. In between checkups, there are plenty of things you can do on your own. Experts have done a lot of research about which lifestyle changes and preventive measures are most likely to keep you healthy. Ask your health care provider for more information. Weight and diet Eat a healthy diet  Be sure to include plenty of vegetables, fruits, low-fat dairy products, and lean protein.  Do not eat a lot of foods high in solid fats, added sugars, or salt.  Get regular exercise. This is one of the most important things you can do for your health. ? Most adults should exercise for at least 150 minutes each week. The exercise should increase your heart rate and make you sweat (moderate-intensity exercise). ? Most adults should also do strengthening exercises at least twice a week. This is in addition to the  moderate-intensity exercise.  Maintain a healthy weight  Body mass index (BMI) is a measurement that can be used to identify possible weight problems. It estimates body fat based on height and weight. Your health care provider can help determine your BMI and help you achieve or maintain a healthy weight.  For females 20 years of age and older: ? A BMI below 18.5 is considered underweight. ? A BMI of 18.5 to 24.9 is normal. ? A BMI of 25 to 29.9 is considered overweight. ? A BMI of 30 and above is considered obese.  Watch levels of cholesterol and blood lipids  You should start having your blood tested for lipids and cholesterol at 36 years of age, then have this test every 5 years.  You may need to have your cholesterol levels checked more often if: ? Your lipid or cholesterol levels are high. ? You are older than 36 years of age. ? You are at high risk for heart disease.  Cancer screening Lung Cancer  Lung cancer screening is recommended for adults 55-80 years old who are at high risk for lung cancer because of a history of smoking.  A yearly low-dose CT scan of the lungs is recommended for people who: ? Currently smoke. ? Have quit within the past 15 years. ? Have at least a 30-pack-year history of smoking. A pack year is smoking an average of one pack of cigarettes a day for 1 year.  Yearly screening should continue until it has been 15 years since you quit.  Yearly   screening should stop if you develop a health problem that would prevent you from having lung cancer treatment.  Breast Cancer  Practice breast self-awareness. This means understanding how your breasts normally appear and feel.  It also means doing regular breast self-exams. Let your health care provider know about any changes, no matter how small.  If you are in your 20s or 30s, you should have a clinical breast exam (CBE) by a health care provider every 1-3 years as part of a regular health exam.  If you  are 40 or older, have a CBE every year. Also consider having a breast X-ray (mammogram) every year.  If you have a family history of breast cancer, talk to your health care provider about genetic screening.  If you are at high risk for breast cancer, talk to your health care provider about having an MRI and a mammogram every year.  Breast cancer gene (BRCA) assessment is recommended for women who have family members with BRCA-related cancers. BRCA-related cancers include: ? Breast. ? Ovarian. ? Tubal. ? Peritoneal cancers.  Results of the assessment will determine the need for genetic counseling and BRCA1 and BRCA2 testing.  Cervical Cancer Your health care provider may recommend that you be screened regularly for cancer of the pelvic organs (ovaries, uterus, and vagina). This screening involves a pelvic examination, including checking for microscopic changes to the surface of your cervix (Pap test). You may be encouraged to have this screening done every 3 years, beginning at age 21.  For women ages 30-65, health care providers may recommend pelvic exams and Pap testing every 3 years, or they may recommend the Pap and pelvic exam, combined with testing for human papilloma virus (HPV), every 5 years. Some types of HPV increase your risk of cervical cancer. Testing for HPV may also be done on women of any age with unclear Pap test results.  Other health care providers may not recommend any screening for nonpregnant women who are considered low risk for pelvic cancer and who do not have symptoms. Ask your health care provider if a screening pelvic exam is right for you.  If you have had past treatment for cervical cancer or a condition that could lead to cancer, you need Pap tests and screening for cancer for at least 20 years after your treatment. If Pap tests have been discontinued, your risk factors (such as having a new sexual partner) need to be reassessed to determine if screening should  resume. Some women have medical problems that increase the chance of getting cervical cancer. In these cases, your health care provider may recommend more frequent screening and Pap tests.  Colorectal Cancer  This type of cancer can be detected and often prevented.  Routine colorectal cancer screening usually begins at 36 years of age and continues through 36 years of age.  Your health care provider may recommend screening at an earlier age if you have risk factors for colon cancer.  Your health care provider may also recommend using home test kits to check for hidden blood in the stool.  A small camera at the end of a tube can be used to examine your colon directly (sigmoidoscopy or colonoscopy). This is done to check for the earliest forms of colorectal cancer.  Routine screening usually begins at age 50.  Direct examination of the colon should be repeated every 5-10 years through 36 years of age. However, you may need to be screened more often if early forms of precancerous polyps   or small growths are found.  Skin Cancer  Check your skin from head to toe regularly.  Tell your health care provider about any new moles or changes in moles, especially if there is a change in a mole's shape or color.  Also tell your health care provider if you have a mole that is larger than the size of a pencil eraser.  Always use sunscreen. Apply sunscreen liberally and repeatedly throughout the day.  Protect yourself by wearing long sleeves, pants, a wide-brimmed hat, and sunglasses whenever you are outside.  Heart disease, diabetes, and high blood pressure  High blood pressure causes heart disease and increases the risk of stroke. High blood pressure is more likely to develop in: ? People who have blood pressure in the high end of the normal range (130-139/85-89 mm Hg). ? People who are overweight or obese. ? People who are African American.  If you are 18-39 years of age, have your blood  pressure checked every 3-5 years. If you are 40 years of age or older, have your blood pressure checked every year. You should have your blood pressure measured twice-once when you are at a hospital or clinic, and once when you are not at a hospital or clinic. Record the average of the two measurements. To check your blood pressure when you are not at a hospital or clinic, you can use: ? An automated blood pressure machine at a pharmacy. ? A home blood pressure monitor.  If you are between 55 years and 79 years old, ask your health care provider if you should take aspirin to prevent strokes.  Have regular diabetes screenings. This involves taking a blood sample to check your fasting blood sugar level. ? If you are at a normal weight and have a low risk for diabetes, have this test once every three years after 36 years of age. ? If you are overweight and have a high risk for diabetes, consider being tested at a younger age or more often. Preventing infection Hepatitis B  If you have a higher risk for hepatitis B, you should be screened for this virus. You are considered at high risk for hepatitis B if: ? You were born in a country where hepatitis B is common. Ask your health care provider which countries are considered high risk. ? Your parents were born in a high-risk country, and you have not been immunized against hepatitis B (hepatitis B vaccine). ? You have HIV or AIDS. ? You use needles to inject street drugs. ? You live with someone who has hepatitis B. ? You have had sex with someone who has hepatitis B. ? You get hemodialysis treatment. ? You take certain medicines for conditions, including cancer, organ transplantation, and autoimmune conditions.  Hepatitis C  Blood testing is recommended for: ? Everyone born from 1945 through 1965. ? Anyone with known risk factors for hepatitis C.  Sexually transmitted infections (STIs)  You should be screened for sexually transmitted  infections (STIs) including gonorrhea and chlamydia if: ? You are sexually active and are younger than 36 years of age. ? You are older than 36 years of age and your health care provider tells you that you are at risk for this type of infection. ? Your sexual activity has changed since you were last screened and you are at an increased risk for chlamydia or gonorrhea. Ask your health care provider if you are at risk.  If you do not have HIV, but are at risk,   risk, it may be recommended that you take a prescription medicine daily to prevent HIV infection. This is called pre-exposure prophylaxis (PrEP). You are considered at risk if: ? You are sexually active and do not regularly use condoms or know the HIV status of your partner(s). ? You take drugs by injection. ? You are sexually active with a partner who has HIV.  Talk with your health care provider about whether you are at high risk of being infected with HIV. If you choose to begin PrEP, you should first be tested for HIV. You should then be tested every 3 months for as long as you are taking PrEP. Pregnancy  If you are premenopausal and you may become pregnant, ask your health care provider about preconception counseling.  If you may become pregnant, take 400 to 800 micrograms (mcg) of folic acid every day.  If you want to prevent pregnancy, talk to your health care provider about birth control (contraception). Osteoporosis and menopause  Osteoporosis is a disease in which the bones lose minerals and strength with aging. This can result in serious bone fractures. Your risk for osteoporosis can be identified using a bone density scan.  If you are 70 years of age or older, or if you are at risk for osteoporosis and fractures, ask your health care provider if you should be screened.  Ask your health care provider whether you should take a calcium or vitamin D supplement to lower your risk for osteoporosis.  Menopause may have certain physical  symptoms and risks.  Hormone replacement therapy may reduce some of these symptoms and risks. Talk to your health care provider about whether hormone replacement therapy is right for you. Follow these instructions at home:  Schedule regular health, dental, and eye exams.  Stay current with your immunizations.  Do not use any tobacco products including cigarettes, chewing tobacco, or electronic cigarettes.  If you are pregnant, do not drink alcohol.  If you are breastfeeding, limit how much and how often you drink alcohol.  Limit alcohol intake to no more than 1 drink per day for nonpregnant women. One drink equals 12 ounces of beer, 5 ounces of wine, or 1 ounces of hard liquor.  Do not use street drugs.  Do not share needles.  Ask your health care provider for help if you need support or information about quitting drugs.  Tell your health care provider if you often feel depressed.  Tell your health care provider if you have ever been abused or do not feel safe at home. This information is not intended to replace advice given to you by your health care provider. Make sure you discuss any questions you have with your health care provider. Document Released: 01/25/2011 Document Revised: 12/18/2015 Document Reviewed: 04/15/2015 Elsevier Interactive Patient Education  2018 [place] (AHA) Exercise Recommendation  Being physically active is important to prevent heart disease and stroke, the nation's No. 1and No. 5killers. To improve overall cardiovascular health, we suggest at least 150 minutes per week of moderate exercise or 75 minutes per week of vigorous exercise (or a combination of moderate and vigorous activity). Thirty minutes a day, five times a week is an easy goal to remember. You will also experience benefits even if you divide your time into two or three segments of 10 to 15 minutes per day.  For people who would benefit from lowering their  blood pressure or cholesterol, we recommend 40 minutes of aerobic exercise of moderate  vigorous intensity three to four times a week to lower the risk for heart attack and stroke.  Physical activity is anything that makes you move your body and burn calories.  This includes things like climbing stairs or playing sports. Aerobic exercises benefit your heart, and include walking, jogging, swimming or biking. Strength and stretching exercises are best for overall stamina and flexibility.  The simplest, positive change you can make to effectively improve your heart health is to start walking. It's enjoyable, free, easy, social and great exercise. A walking program is flexible and boasts high success rates because people can stick with it. It's easy for walking to become a regular and satisfying part of life.   For Overall Cardiovascular Health:  At least 30 minutes of moderate-intensity aerobic activity at least 5 days per week for a total of 150  OR   At least 25 minutes of vigorous aerobic activity at least 3 days per week for a total of 75 minutes; or a combination of moderate- and vigorous-intensity aerobic activity  AND   Moderate- to high-intensity muscle-strengthening activity at least 2 days per week for additional health benefits.  For Lowering Blood Pressure and Cholesterol  An average 40 minutes of moderate- to vigorous-intensity aerobic activity 3 or 4 times per week  What if I can't make it to the time goal? Something is always better than nothing! And everyone has to start somewhere. Even if you've been sedentary for years, today is the day you can begin to make healthy changes in your life. If you don't think you'll make it for 30 or 40 minutes, set a reachable goal for today. You can work up toward your overall goal by increasing your time as you get stronger. Don't let all-or-nothing thinking rob you of doing what you can every day.  Source:http://www.heart.org    

## 2017-03-26 NOTE — Progress Notes (Signed)
[name] 36 y.o.   Chief Complaint  Patient presents with  . Establish Care    est care & CPE  . Flu Vaccine    wants flu shot today  . Annual Exam    pt wanted her CPE done today    HISTORY OF PRESENT ILLNESS: This is a 36 y.o. female here for annual exam.  HPI   Prior to Admission medications   Medication Sig Start Date End Date Taking? Authorizing Provider  acetaminophen (TYLENOL) 500 MG tablet Take 1,000 mg by mouth every 6 (six) hours as needed for mild pain or headache.   Yes [provider]  albuterol (PROVENTIL HFA;VENTOLIN HFA) 108 (90 BASE) MCG/ACT inhaler Inhale 1 puff into the lungs every 6 (six) hours as needed for wheezing or shortness of breath.   Yes [provider]  levonorgestrel (MIRENA) 20 MCG/24HR IUD 1 each by Intrauterine route once.   Yes [provider]  Prenatal Vit-Fe Fumarate-FA (PRENATAL MULTIVITAMIN) TABS tablet Take 1 tablet by mouth daily at 12 noon.   Yes [provider]    Allergies  Allergen Reactions  . Clindamycin/Lincomycin Shortness Of Breath, Rash and Other (See Comments)    Elevated BP  . Penicillins Anaphylaxis  . Monistat [Miconazole Nitrate-Wipes] Other (See Comments)    'cracks my skin open'  . Neosporin [Neomycin-Bacitracin Zn-Polymyx] Other (See Comments)    'cracks my skin open'  . Betadine [Povidone Iodine] Hives and Rash    Patient Active Problem List   Diagnosis Date Noted  . 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 OBESITY, NOS 09/22/2006  . DEPRESSION, MAJOR, RECURRENT 09/22/2006  . RHINITIS, ALLERGIC 09/22/2006  . 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 IRRITABLE BOWEL SYNDROME 09/22/2006    Past Medical History:  Diagnosis Date  . Anxiety   . Asthma   . GERD (gastroesophageal reflux disease)   . Panic attack   . PTSD (post-traumatic stress disorder)     Past Surgical History:  Procedure Laterality Date  . ADENOIDECTOMY    . CESAREAN SECTION    . lymph node disection    .  MYRINGOTOMY    . reduction of turbinates    . TONSILLECTOMY      Social History   Social History  . Marital status: Divorced    Spouse name: N/A  . Number of children: N/A  . Years of education: N/A   Occupational History  . Not on file.   Social History Main Topics  . Smoking status: Former Smoker    Quit date: 03/11/2017  . Smokeless tobacco: Former Systems developer  . Alcohol use No  . Drug use: No  . Sexual activity: Yes    Birth control/ protection: IUD   Other Topics Concern  . Not on file   Social History Narrative  . No narrative on file    Family History  Problem Relation Age of Onset  . Hepatitis C Mother   . Hypertension Mother   . Diabetes Mother   . Hepatitis C Father   . Hypertension Father   . Diabetes Other   . Hypertension Other   . Arthritis Other      Review of Systems  Constitutional: Negative for chills and fever.  HENT: Negative.   Eyes: Negative.   Respiratory: Negative.  Negative for cough and shortness of breath.   Cardiovascular: Negative for chest pain and palpitations.  Gastrointestinal: Negative for abdominal pain, diarrhea, nausea and vomiting.  Genitourinary: Negative.  Negative for dysuria and hematuria.  Musculoskeletal: Negative.  Negative for myalgias and neck pain.  Skin: Negative.  Negative for rash.  Neurological: Positive for headaches (3-4/ week).  Endo/Heme/Allergies: Negative.   All other systems reviewed and are negative.  Vitals:   03/26/17 1117  BP: 127/76  Pulse: 64  Resp: 17  Temp: 98.2 F (36.8 C)  SpO2: 100%     Physical Exam  Constitutional: She is oriented to person, place, and time. She appears well-developed and well-nourished.  HENT:  Head: Normocephalic and atraumatic.  Right Ear: External ear normal.  Left Ear: External ear normal.  Nose: Nose normal.  Mouth/Throat: Oropharynx is clear and moist.  Eyes: Pupils are equal, round, and reactive to light. Conjunctivae and EOM are normal.  Neck: Normal  range of motion. Neck supple. No JVD present. No thyromegaly present.  Cardiovascular: Normal rate, regular rhythm, normal heart sounds and intact distal pulses.   Pulmonary/Chest: Effort normal and breath sounds normal.  Abdominal: Soft. Bowel sounds are normal. She exhibits no distension. There is no tenderness.  Musculoskeletal: Normal range of motion.  Lymphadenopathy:    She has no cervical adenopathy.  Neurological: She is alert and oriented to person, place, and time. No sensory deficit. She exhibits normal muscle tone.  Skin: Skin is warm and dry. No rash noted.  Psychiatric: She has a normal mood and affect. Her behavior is normal.  Vitals reviewed.    ASSESSMENT & PLAN: Danese was seen today for establish care, flu vaccine and annual exam.  Diagnoses and all orders for this visit:  Routine general medical examination at a health care facility -     CBC with Differential -     Comprehensive metabolic panel -     Hemoglobin A1c -     Lipid panel -     TSH  Flu vaccine need -     Cancel: Flu Vaccine QUAD 36+ mos IM   Patient Instructions       IF you received an x-ray today, you will receive an invoice from [facility]. Please contact [facility] at [phone] with questions or concerns regarding your invoice.   IF you received labwork today, you will receive an invoice from [facility]. Please contact [facility] at [phone] with questions or concerns regarding your invoice.   Our billing staff will not be able to assist you with questions regarding bills from these companies.  You will be contacted with the lab results as soon as they are available. The fastest way to get your results is to activate your My Chart account. Instructions are located on the last page of this paperwork. If you have not heard from [place] regarding the results in 2 weeks, please contact this office.       Health Maintenance, Female Adopting a healthy lifestyle and  getting preventive care can go a long way to promote health and wellness. Talk with your health care provider about what schedule of regular examinations is right for you. This is a good chance for you to check in with your provider about disease prevention and staying healthy. In between checkups, there are plenty of things you can do on your own. Experts have done a lot of research about which lifestyle changes and preventive measures are most likely to keep you healthy. Ask your health care provider for more information. Weight and diet Eat a healthy diet  Be sure to include plenty of vegetables, fruits, low-fat dairy products, and lean protein.  Do not eat a lot of  foods high in solid fats, added sugars, or salt.  Get regular exercise. This is one of the most important things you can do for your health. ? Most adults should exercise for at least 150 minutes each week. The exercise should increase your heart rate and make you sweat (moderate-intensity exercise). ? Most adults should also do strengthening exercises at least twice a week. This is in addition to the moderate-intensity exercise.  Maintain a healthy weight  Body mass index (BMI) is a measurement that can be used to identify possible weight problems. It estimates body fat based on height and weight. Your health care provider can help determine your BMI and help you achieve or maintain a healthy weight.  For females 87 years of age and older: ? A BMI below 18.5 is considered underweight. ? A BMI of 18.5 to 24.9 is normal. ? A BMI of 25 to 29.9 is considered overweight. ? A BMI of 30 and above is considered obese.  Watch levels of cholesterol and blood lipids  You should start having your blood tested for lipids and cholesterol at 36 years of age, then have this test every 5 years.  You may need to have your cholesterol levels checked more often if: ? Your lipid or cholesterol levels are high. ? You are older than 36 years of  age. ? You are at high risk for heart disease.  Cancer screening Lung Cancer  Lung cancer screening is recommended for adults 52-58 years old who are at high risk for lung cancer because of a history of smoking.  A yearly low-dose CT scan of the lungs is recommended for people who: ? Currently smoke. ? Have quit within the past 15 years. ? Have at least a 30-pack-year history of smoking. A pack year is smoking an average of one pack of cigarettes a day for 1 year.  Yearly screening should continue until it has been 15 years since you quit.  Yearly screening should stop if you develop a health problem that would prevent you from having lung cancer treatment.  Breast Cancer  Practice breast self-awareness. This means understanding how your breasts normally appear and feel.  It also means doing regular breast self-exams. Let your health care provider know about any changes, no matter how small.  If you are in your 20s or 30s, you should have a clinical breast exam (CBE) by a health care provider every 1-3 years as part of a regular health exam.  If you are 35 or older, have a CBE every year. Also consider having a breast X-ray (mammogram) every year.  If you have a family history of breast cancer, talk to your health care provider about genetic screening.  If you are at high risk for breast cancer, talk to your health care provider about having an MRI and a mammogram every year.  Breast cancer gene (BRCA) assessment is recommended for women who have family members with BRCA-related cancers. BRCA-related cancers include: ? Breast. ? Ovarian. ? Tubal. ? Peritoneal cancers.  Results of the assessment will determine the need for genetic counseling and BRCA1 and BRCA2 testing.  Cervical Cancer Your health care provider may recommend that you be screened regularly for cancer of the pelvic organs (ovaries, uterus, and vagina). This screening involves a pelvic examination, including  checking for microscopic changes to the surface of your cervix (Pap test). You may be encouraged to have this screening done every 3 years, beginning at age 57.  For women ages  30-65, health care providers may recommend pelvic exams and Pap testing every 3 years, or they may recommend the Pap and pelvic exam, combined with testing for human papilloma virus (HPV), every 5 years. Some types of HPV increase your risk of cervical cancer. Testing for HPV may also be done on women of any age with unclear Pap test results.  Other health care providers may not recommend any screening for nonpregnant women who are considered low risk for pelvic cancer and who do not have symptoms. Ask your health care provider if a screening pelvic exam is right for you.  If you have had past treatment for cervical cancer or a condition that could lead to cancer, you need Pap tests and screening for cancer for at least 20 years after your treatment. If Pap tests have been discontinued, your risk factors (such as having a new sexual partner) need to be reassessed to determine if screening should resume. Some women have medical problems that increase the chance of getting cervical cancer. In these cases, your health care provider may recommend more frequent screening and Pap tests.  Colorectal Cancer  This type of cancer can be detected and often prevented.  Routine colorectal cancer screening usually begins at 36 years of age and continues through 36 years of age.  Your health care provider may recommend screening at an earlier age if you have risk factors for colon cancer.  Your health care provider may also recommend using home test kits to check for hidden blood in the stool.  A small camera at the end of a tube can be used to examine your colon directly (sigmoidoscopy or colonoscopy). This is done to check for the earliest forms of colorectal cancer.  Routine screening usually begins at age 66.  Direct examination of  the colon should be repeated every 5-10 years through 36 years of age. However, you may need to be screened more often if early forms of precancerous polyps or small growths are found.  Skin Cancer  Check your skin from head to toe regularly.  Tell your health care provider about any new moles or changes in moles, especially if there is a change in a mole's shape or color.  Also tell your health care provider if you have a mole that is larger than the size of a pencil eraser.  Always use sunscreen. Apply sunscreen liberally and repeatedly throughout the day.  Protect yourself by wearing long sleeves, pants, a wide-brimmed hat, and sunglasses whenever you are outside.  Heart disease, diabetes, and high blood pressure  High blood pressure causes heart disease and increases the risk of stroke. High blood pressure is more likely to develop in: ? People who have blood pressure in the high end of the normal range (130-139/85-89 mm Hg). ? People who are overweight or obese. ? People who are African American.  If you are 37-78 years of age, have your blood pressure checked every 3-5 years. If you are 21 years of age or older, have your blood pressure checked every year. You should have your blood pressure measured twice-once when you are at a hospital or clinic, and once when you are not at a hospital or clinic. Record the average of the two measurements. To check your blood pressure when you are not at a hospital or clinic, you can use: ? An automated blood pressure machine at a pharmacy. ? A home blood pressure monitor.  If you are between 43 years and 36 years old,  ask your health care provider if you should take aspirin to prevent strokes.  Have regular diabetes screenings. This involves taking a blood sample to check your fasting blood sugar level. ? If you are at a normal weight and have a low risk for diabetes, have this test once every three years after 36 years of age. ? If you are  overweight and have a high risk for diabetes, consider being tested at a younger age or more often. Preventing infection Hepatitis B  If you have a higher risk for hepatitis B, you should be screened for this virus. You are considered at high risk for hepatitis B if: ? You were born in a country where hepatitis B is common. Ask your health care provider which countries are considered high risk. ? Your parents were born in a high-risk country, and you have not been immunized against hepatitis B (hepatitis B vaccine). ? You have HIV or AIDS. ? You use needles to inject street drugs. ? You live with someone who has hepatitis B. ? You have had sex with someone who has hepatitis B. ? You get hemodialysis treatment. ? You take certain medicines for conditions, including cancer, organ transplantation, and autoimmune conditions.  Hepatitis C  Blood testing is recommended for: ? Everyone born from [birth year] through 1965. ? Anyone with known risk factors for hepatitis C.  Sexually transmitted infections (STIs)  You should be screened for sexually transmitted infections (STIs) including gonorrhea and chlamydia if: ? You are sexually active and are younger than 36 years of age. ? You are older than 36 years of age and your health care provider tells you that you are at risk for this type of infection. ? Your sexual activity has changed since you were last screened and you are at an increased risk for chlamydia or gonorrhea. Ask your health care provider if you are at risk.  If you do not have HIV, but are at risk, it may be recommended that you take a prescription medicine daily to prevent HIV infection. This is called pre-exposure prophylaxis (PrEP). You are considered at risk if: ? You are sexually active and do not regularly use condoms or know the HIV status of your partner(s). ? You take drugs by injection. ? You are sexually active with a partner who has HIV.  Talk with your health care provider  about whether you are at high risk of being infected with HIV. If you choose to begin PrEP, you should first be tested for HIV. You should then be tested every 3 months for as long as you are taking PrEP. Pregnancy  If you are premenopausal and you may become pregnant, ask your health care provider about preconception counseling.  If you may become pregnant, take 400 to 800 micrograms (mcg) of folic acid every day.  If you want to prevent pregnancy, talk to your health care provider about birth control (contraception). Osteoporosis and menopause  Osteoporosis is a disease in which the bones lose minerals and strength with aging. This can result in serious bone fractures. Your risk for osteoporosis can be identified using a bone density scan.  If you are 24 years of age or older, or if you are at risk for osteoporosis and fractures, ask your health care provider if you should be screened.  Ask your health care provider whether you should take a calcium or vitamin D supplement to lower your risk for osteoporosis.  Menopause may have certain physical symptoms and risks.  Hormone replacement therapy may reduce some of these symptoms and risks. Talk to your health care provider about whether hormone replacement therapy is right for you. Follow these instructions at home:  Schedule regular health, dental, and eye exams.  Stay current with your immunizations.  Do not use any tobacco products including cigarettes, chewing tobacco, or electronic cigarettes.  If you are pregnant, do not drink alcohol.  If you are breastfeeding, limit how much and how often you drink alcohol.  Limit alcohol intake to no more than 1 drink per day for nonpregnant women. One drink equals 12 ounces of beer, 5 ounces of wine, or 1 ounces of hard liquor.  Do not use street drugs.  Do not share needles.  Ask your health care provider for help if you need support or information about quitting drugs.  Tell your  health care provider if you often feel depressed.  Tell your health care provider if you have ever been abused or do not feel safe at home. This information is not intended to replace advice given to you by your health care provider. Make sure you discuss any questions you have with your health care provider. Document Released: 01/25/2011 Document Revised: 12/18/2015 Document Reviewed: 04/15/2015 Elsevier Interactive Patient Education  2018 [place] (AHA) Exercise Recommendation  Being physically active is important to prevent heart disease and stroke, the nation's No. 1and No. 5killers. To improve overall cardiovascular health, we suggest at least 150 minutes per week of moderate exercise or 75 minutes per week of vigorous exercise (or a combination of moderate and vigorous activity). Thirty minutes a day, five times a week is an easy goal to remember. You will also experience benefits even if you divide your time into two or three segments of 10 to 15 minutes per day.  For people who would benefit from lowering their blood pressure or cholesterol, we recommend 40 minutes of aerobic exercise of moderate to vigorous intensity three to four times a week to lower the risk for heart attack and stroke.  Physical activity is anything that makes you move your body and burn calories.  This includes things like climbing stairs or playing sports. Aerobic exercises benefit your heart, and include walking, jogging, swimming or biking. Strength and stretching exercises are best for overall stamina and flexibility.  The simplest, positive change you can make to effectively improve your heart health is to start walking. It's enjoyable, free, easy, social and great exercise. A walking program is flexible and boasts high success rates because people can stick with it. It's easy for walking to become a regular and satisfying part of life.   For Overall Cardiovascular Health:  At  least 30 minutes of moderate-intensity aerobic activity at least 5 days per week for a total of 150  OR   At least 25 minutes of vigorous aerobic activity at least 3 days per week for a total of 75 minutes; or a combination of moderate- and vigorous-intensity aerobic activity  AND   Moderate- to high-intensity muscle-strengthening activity at least 2 days per week for additional health benefits.  For Lowering Blood Pressure and Cholesterol  An average 40 minutes of moderate- to vigorous-intensity aerobic activity 3 or 4 times per week  What if I can't make it to the time goal? Something is always better than nothing! And everyone has to start somewhere. Even if you've been sedentary for years, today is the day you can begin to make healthy changes in  your life. If you don't think you'll make it for 30 or 40 minutes, set a reachable goal for today. You can work up toward your overall goal by increasing your time as you get stronger. Don't let all-or-nothing thinking rob you of doing what you can every day.  Source:http://www.heart.Burnadette Pop, MD [facility]

## 2017-03-29 LAB — COMPREHENSIVE METABOLIC PANEL
ALK PHOS: 34 IU/L — AB (ref 39–117)
ALT: 17 IU/L (ref 0–32)
AST: 21 IU/L (ref 0–40)
Albumin/Globulin Ratio: 1.7 (ref 1.2–2.2)
Albumin: 4.3 g/dL (ref 3.5–5.5)
BUN/Creatinine Ratio: 19 (ref 9–23)
BUN: 14 mg/dL (ref 6–20)
Bilirubin Total: 0.3 mg/dL (ref 0.0–1.2)
CALCIUM: 9.6 mg/dL (ref 8.7–10.2)
CO2: 24 mmol/L (ref 20–29)
CREATININE: 0.73 mg/dL (ref 0.57–1.00)
Chloride: 103 mmol/L (ref 96–106)
GFR calc Af Amer: 123 mL/min/{1.73_m2} (ref >59)
GFR, EST NON AFRICAN AMERICAN: 107 mL/min/{1.73_m2} (ref >59)
GLUCOSE: 85 mg/dL (ref 65–99)
Globulin, Total: 2.6 g/dL (ref 1.5–4.5)
Potassium: 5 mmol/L (ref 3.5–5.2)
Sodium: 140 mmol/L (ref 134–144)
Total Protein: 6.9 g/dL (ref 6.0–8.5)

## 2017-03-29 LAB — CBC WITH DIFFERENTIAL/PLATELET
BASOS ABS: 0 10*3/uL (ref 0.0–0.2)
Basos: 0 %
EOS (ABSOLUTE): 0.1 10*3/uL (ref 0.0–0.4)
EOS: 2 %
Hematocrit: 39.2 % (ref 34.0–46.6)
Hemoglobin: 13.1 g/dL (ref 11.1–15.9)
IMMATURE GRANULOCYTES: 0 %
Immature Grans (Abs): 0 10*3/uL (ref 0.0–0.1)
LYMPHS ABS: 2.1 10*3/uL (ref 0.7–3.1)
Lymphs: 40 %
MCH: 31.4 pg (ref 26.6–33.0)
MCHC: 33.4 g/dL (ref 31.5–35.7)
MCV: 94 fL (ref 79–97)
MONOCYTES: 7 %
MONOS ABS: 0.4 10*3/uL (ref 0.1–0.9)
NEUTROS PCT: 51 %
Neutrophils Absolute: 2.7 10*3/uL (ref 1.4–7.0)
Platelets: 232 10*3/uL (ref 150–379)
RBC: 4.17 x10E6/uL (ref 3.77–5.28)
RDW: 13.2 % (ref 12.3–15.4)
WBC: 5.3 10*3/uL (ref 3.4–10.8)

## 2017-03-29 LAB — LIPID PANEL
CHOLESTEROL TOTAL: 144 mg/dL (ref 100–199)
Chol/HDL Ratio: 2.4 ratio (ref 0.0–4.4)
HDL: 60 mg/dL (ref >39)
LDL Calculated: 77 mg/dL (ref 0–99)
TRIGLYCERIDES: 34 mg/dL (ref 0–149)
VLDL Cholesterol Cal: 7 mg/dL (ref 5–40)

## 2017-03-29 LAB — HEMOGLOBIN A1C
ESTIMATED AVERAGE GLUCOSE: 97 mg/dL
HEMOGLOBIN A1C: 5 % (ref 4.8–5.6)

## 2017-03-29 LAB — TSH: TSH: 1.39 u[IU]/mL (ref 0.450–4.500)

## 2017-03-31 ENCOUNTER — Encounter: Payer: Self-pay | Admitting: Radiology

## 2017-04-14 ENCOUNTER — Telehealth: Payer: Self-pay | Admitting: Emergency Medicine

## 2017-04-14 NOTE — Telephone Encounter (Signed)
Pt called stating she started with a congested nose and started taking zyrtec which helped a little. She said now she feels it is in th back of her nose and she has a headache and is having soreness when swallowing. Says she has to swallow hard to get anything to go down but is not having trouble breathing. She said her ears are popping when swallowing as well. Pt wanted to know if there is something over the counter she can take. She is willing to come in since she has only been seen here for establish care but would prefer to see Dr. Irving Shows and wait until he is back in. She said if she needs to come in before she can. Please advise. Pt can be reached at [phone].

## 2017-04-18 NOTE — Telephone Encounter (Signed)
LM message detailed Per Release.   Patient can call and schedule with [name] if symptoms have not resolved.

## 2017-05-16 ENCOUNTER — Ambulatory Visit (INDEPENDENT_AMBULATORY_CARE_PROVIDER_SITE_OTHER): Payer: No Typology Code available for payment source | Admitting: Physician Assistant

## 2017-05-16 ENCOUNTER — Encounter: Payer: Self-pay | Admitting: Physician Assistant

## 2017-05-16 VITALS — BP 122/86 | HR 68 | Temp 99.0°F | Resp 18 | Ht 64.57 in | Wt 185.4 lb

## 2017-05-16 DIAGNOSIS — N63 Unspecified lump in unspecified breast: Secondary | ICD-10-CM | POA: Diagnosis not present

## 2017-05-16 DIAGNOSIS — Z23 Encounter for immunization: Secondary | ICD-10-CM

## 2017-05-16 NOTE — Patient Instructions (Addendum)
I have placed the orders for you to have both a mammogram and ultrasound of the left breast. This should happen in the next week. In the meantime, I would experiment with both ice and heat and apply whichever feels best to you at least 4-5 x a day. Avoid wearing tight fitting bras or bras with wire. You may also try taking ibuprofen as prescribed to see if this can help with pain. If you develop any redness, warmth, nipple discharge, or fever please seek care immediately. Otherwise, I will contact you with your lab results and we will discuss further treatment plan. Thank you for letting me participate in your health and well being.

## 2017-05-16 NOTE — Progress Notes (Signed)
Kelly RobinsonsKimberly S Keeling  MRN: [id number] DOB: 02-24-1981  Subjective:  [name] is a 36 y.o. female seen in office today for a chief complaint of left breast mass x > one year. For the past 2 weeks she has associated pain in the location. Notes the mass has gotten bigger. The pain is constant. Pain is made better with applied light pressure. It is worsened with certain movements and bras. Denies redness, warmth, purulent drainage, nipple discharge, changes in color of breast skin. She has been evaluated for this back in 02/2016. Exam showed palpable left breast mass at 3 o clock near areola, no tenderness. [facility] and US, which showed left breast hamartoma. At that time the recommendation was surgical consult. However, pt notes she was contacted by the office and because she was not having any pain, she should not see the specialist. She never followed up about the breast mass. Has Mirena placed. LMP 05/14/17. Occur every 28 days. Last anywhere from 3-7 days. No personal hx of breast cysts or breast cancer. Has FH of breast cancer in [facility] at age 36. Has tried tylenol with no releif.   Review of Systems  Constitutional: Negative for chills, diaphoresis, fever and unexpected weight change.  Respiratory: Negative for cough, choking, chest tightness and shortness of breath.   Cardiovascular: Negative for chest pain and palpitations.    Patient Active Problem List   Diagnosis Date Noted  . 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 OBESITY, NOS 09/22/2006  . DEPRESSION, MAJOR, RECURRENT 09/22/2006  . RHINITIS, ALLERGIC 09/22/2006  . 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 IRRITABLE BOWEL SYNDROME 09/22/2006    Current Outpatient Prescriptions on File Prior to Visit  Medication Sig Dispense Refill  . acetaminophen (TYLENOL) 500 MG tablet Take 1,000 mg by mouth every 6 (six) hours as needed for mild pain or headache.    . albuterol (PROVENTIL HFA;VENTOLIN HFA) 108 (90  BASE) MCG/ACT inhaler Inhale 1 puff into the lungs every 6 (six) hours as needed for wheezing or shortness of breath.    . levonorgestrel (MIRENA) 20 MCG/24HR IUD 1 each by Intrauterine route once.    . Prenatal Vit-Fe Fumarate-FA (PRENATAL MULTIVITAMIN) TABS tablet Take 1 tablet by mouth daily at 12 noon.     No current facility-administered medications on file prior to visit.     Allergies  Allergen Reactions  . Clindamycin/Lincomycin Shortness Of Breath, Rash and Other (See Comments)    Elevated BP  . Penicillins Anaphylaxis  . Monistat [Miconazole Nitrate-Wipes] Other (See Comments)    'cracks my skin open'  . Neosporin [Neomycin-Bacitracin Zn-Polymyx] Other (See Comments)    'cracks my skin open'  . Betadine [Povidone Iodine] Hives and Rash     Objective:  BP 122/86 (BP Location: Right Arm, Patient Position: Sitting, Cuff Size: Normal)   Pulse 68   Temp 99 F (37.2 C) (Oral)   Resp 18   Ht 5' 4.57" (1.64 m)   Wt 185 lb 6.4 oz (84.1 kg)   LMP 05/14/2017 (Approximate)   SpO2 98%   BMI 31.27 kg/m   Physical Exam  Constitutional: She is oriented to person, place, and time and well-developed, well-nourished, and in no distress.  HENT:  Head: Normocephalic and atraumatic.  Eyes: Conjunctivae are normal.  Neck: Normal range of motion.  Cardiovascular: Normal rate, regular rhythm and normal heart sounds.   Pulmonary/Chest: Effort normal. Right breast exhibits no inverted nipple, no mass, no nipple discharge, no skin  change and no tenderness. Left breast exhibits mass (palpable freely mobile mass noted at 3 o clock position just lateral to areola, there is tenderness with deep palpation. No overlying erythema or warmth palpated) and tenderness. Left breast exhibits no inverted nipple, no nipple discharge and no skin change. Breasts are symmetrical.    Lymphadenopathy:    She has no axillary adenopathy.  Neurological: She is alert and oriented to person, place, and time. Gait  normal.  Skin: Skin is warm and dry.  Psychiatric: Affect normal.  Vitals reviewed.   Wt Readings from Last 3 Encounters:  05/16/17 185 lb 6.4 oz (84.1 kg)  03/26/17 179 lb 2 oz (81.3 kg)  07/07/16 158 lb (71.7 kg)    Assessment and Plan :  1. Breast mass Per chart review, when she went to health dept in 02/2016, she was complaing of the same breast mass at that time for 1.5 years and per chart review it was painful and waking her up from sleep. Recommend reimaging at this time as it has been >1 year since last imaging and pt notes the mass has grown and become more painful. Encouraged pt to avoid tight fitting bras, apply ice/heat as tolerated and use OTC ibuprofen as prescribed for pain. Will discuss further tx plan once imaging results return.  - MM DIAG BREAST TOMO BILATERAL; Future - US BREAST LTD UNI LEFT INC AXILLA; Future  2. Need for immunization against influenza  Flu Vaccine QUAD 36+ mos IM   Benjiman Core PA-C  Primary [facility] at [facility] 05/16/2017 9:08 AM

## 2017-05-23 ENCOUNTER — Ambulatory Visit
Admission: RE | Admit: 2017-05-23 | Discharge: 2017-05-23 | Disposition: A | Discharge status: Home / Self Care | Payer: PRIVATE HEALTH INSURANCE | Source: Ambulatory Visit | Attending: Physician Assistant | Admitting: Physician Assistant

## 2017-05-23 DIAGNOSIS — N63 Unspecified lump in unspecified breast: Secondary | ICD-10-CM

## 2017-06-07 ENCOUNTER — Other Ambulatory Visit: Payer: Self-pay | Admitting: Surgery

## 2017-06-07 DIAGNOSIS — N6320 Unspecified lump in the left breast, unspecified quadrant: Secondary | ICD-10-CM

## 2017-06-07 DIAGNOSIS — N644 Mastodynia: Principal | ICD-10-CM

## 2017-06-10 ENCOUNTER — Other Ambulatory Visit: Payer: Self-pay | Admitting: Surgery

## 2017-06-10 ENCOUNTER — Ambulatory Visit
Admission: RE | Admit: 2017-06-10 | Discharge: 2017-06-10 | Disposition: A | Discharge status: Home / Self Care | Payer: PRIVATE HEALTH INSURANCE | Source: Ambulatory Visit | Attending: Surgery | Admitting: Surgery

## 2017-06-10 DIAGNOSIS — N6320 Unspecified lump in the left breast, unspecified quadrant: Secondary | ICD-10-CM

## 2017-06-10 DIAGNOSIS — N644 Mastodynia: Principal | ICD-10-CM

## 2017-06-20 ENCOUNTER — Ambulatory Visit: Payer: Self-pay | Admitting: Surgery

## 2017-06-20 DIAGNOSIS — N6320 Unspecified lump in the left breast, unspecified quadrant: Secondary | ICD-10-CM

## 2017-06-20 NOTE — H&P (Signed)
[name] 06/20/2017 9:49 AM Location: [facility] Patient #: [id number] DOB: 1980/10/20 Divorced / Language: English / Race: Black or African American Female  History of Present Illness [name](Kariya Lavergne A. Dalma Panchal MD; 06/20/2017 10:30 AM) Patient words: Patient returns after needle core biopsy of left breast mass. 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 It is still painful. We discussed options of medical management versus surgical removal. I discussed limitations of surgical removal with success rates of roughly 50% in relieving symptoms. She has tried all the upper counter medications and desires excision of this area.  The patient is a 36 year old female.   Past Surgical History (Tanisha A. Manson PasseyBrown, RMA; 06/20/2017 9:49 AM) Cesarean Section - 1 Tonsillectomy  Diagnostic Studies History (Tanisha A. Manson PasseyBrown, RMA; 06/20/2017 9:49 AM) Colonoscopy >10 years ago Mammogram within last year Pap Smear 1-5 years ago  Allergies (Tanisha A. Manson PasseyBrown, RMA; 06/20/2017 9:51 AM) Penicillins Neosporin AF *DERMATOLOGICALS* Miconazole *ANTIFUNGALS* Betadine *ANTISEPTICS & DISINFECTANTS* Clindamycin HCl *ANTI-INFECTIVE AGENTS - MISC.* Allergies Reconciled  Medication History (Tanisha A. Manson PasseyBrown, RMA; 06/20/2017 9:51 AM) Motrin (100MG  Tablet, Oral) Active. Proventil HFA (108 (90 Base)MCG/ACT Aerosol Soln, Inhalation) Active. Mirena (52 MG) (20MCG/24HR IUD, Intrauterine) Active. Prenatal Forte (Oral) Active. Medications Reconciled  Social History (Tanisha A. Manson PasseyBrown, RMA; 06/20/2017 9:50 AM) Caffeine use Coffee, Tea. No alcohol use No drug use Tobacco use Former smoker.  Family History (Tanisha A. Manson PasseyBrown, RMA; 06/20/2017 9:50 AM) Alcohol Abuse Brother, Father. Arthritis Mother. Bleeding disorder Family Members In General, Mother. Breast Cancer Family Members In General. Cancer Family Members In General, Mother. Colon Polyps Mother. Depression  Sister, Son. Diabetes Mellitus Family Members In General, Mother. Heart Disease Family Members In General. Hypertension Family Members In General, Father, Mother. Respiratory Condition Brother, Family Members In General. Seizure disorder Family [place] In [place], Son. Thyroid problems Family Members In General.  Pregnancy / Birth History (Tanisha A. Manson PasseyBrown, RMA; 06/20/2017 9:50 AM) Age at menarche 12 years. Contraceptive History Intrauterine device. Gravida 5 Length (months) of breastfeeding 12-24 Maternal age 36-20 Para 4 Regular periods  Other Problems (Tanisha A. Manson PasseyBrown, RMA; 06/20/2017 9:50 AM) Anxiety Disorder Asthma Back Pain Bladder Problems Depression Gastroesophageal Reflux Disease Lump In Breast Migraine Headache Other disease, cancer, significant illness     Review of Systems (Tanisha A. Brown RMA; 06/20/2017 9:50 AM) General Not Present- Appetite Loss, Chills, Fatigue, Fever, Night Sweats, Weight Gain and Weight Loss. Skin Not Present- Change in Wart/Mole, Dryness, Hives, Jaundice, New Lesions, Non-Healing Wounds, Rash and Ulcer. HEENT Present- Earache. Not Present- Hearing Loss, Hoarseness, Nose Bleed, Oral Ulcers, Ringing in the Ears, Seasonal Allergies, Sinus Pain, Sore Throat, Visual Disturbances, Wears glasses/contact lenses and Yellow Eyes. Respiratory Not Present- Bloody sputum, Chronic Cough, Difficulty Breathing, Snoring and Wheezing. Breast Present- Breast Mass and Breast Pain. Not Present- Nipple Discharge and Skin Changes. Cardiovascular Not Present- Chest Pain, Difficulty Breathing Lying Down, Leg Cramps, Palpitations, Rapid Heart Rate, Shortness of Breath and Swelling of Extremities. Gastrointestinal Present- Excessive gas. Not Present- Abdominal Pain, Bloating, Bloody Stool, Change in Bowel Habits, Chronic diarrhea, Constipation, Difficulty Swallowing, Gets full quickly at meals, Hemorrhoids, Indigestion, Nausea, Rectal Pain and  Vomiting. Female Genitourinary Present- Frequency and Urgency. Not Present- Nocturia, Painful Urination and Pelvic Pain. Musculoskeletal Not Present- Back Pain, Joint Pain, Joint Stiffness, Muscle Pain, Muscle Weakness and Swelling of Extremities. Neurological Present- Headaches. Not Present- Decreased Memory, Fainting, Numbness, Seizures, Tingling, Tremor, Trouble walking and Weakness. Psychiatric Not Present- Anxiety, Bipolar, Change in Sleep Pattern, Depression, Fearful  and Frequent crying. Endocrine Not Present- Cold Intolerance, Excessive Hunger, Hair Changes, Heat Intolerance, Hot flashes and New Diabetes. Hematology Not Present- Blood Thinners, Easy Bruising, Excessive bleeding, Gland problems, HIV and Persistent Infections.  Vitals (Tanisha A. Brown RMA; 06/20/2017 9:51 AM) 06/20/2017 9:50 AM Weight: 189.4 lb Height: 63in Body Surface Area: 1.89 m Body Mass Index: 33.55 kg/m  Temp.: 97.32F  Pulse: 77 (Regular)  BP: 136/82 (Sitting, Left Arm, Standard)      Physical Exam (Tacy Chavis A. Davonna Ertl MD; 06/20/2017 10:30 AM)  General Mental Status-Alert. General Appearance-Consistent with stated age. Hydration-Well hydrated. Voice-Normal.  Head and Neck Head-normocephalic, atraumatic with no lesions or palpable masses. Trachea-midline. Thyroid Gland Characteristics - normal size and consistency.  Eye Eyeball - Bilateral-Extraocular movements intact. Sclera/Conjunctiva - Bilateral-No scleral icterus.  Breast Note: not reexamined today  Neurologic Neurologic evaluation reveals -alert and oriented x 3 with no impairment of recent or remote memory. Mental Status-Normal.    Assessment & Plan (Corliss Lamartina A. Garren Greenman MD; 06/20/2017 10:31 AM)  PAINFUL LUMPY LEFT BREAST (N64.4) Impression: Pt desires excision after discussing options Risk of lumpectomy include bleeding, infection, seroma, more surgery, use of seed/wire, wound care, cosmetic deformity  and the need for other treatments, death , blood clots, death. Pt agrees to proceed.  Succes at releiving pain are 50 % cosmetic outcome and medical management discussed as well  Current Plans You are being scheduled for surgery- Our schedulers will call you.  You should hear from our office's scheduling department within 5 working days about the location, date, and time of surgery. We try to make accommodations for patient's preferences in scheduling surgery, but sometimes the OR schedule or the surgeon's schedule prevents us from making those accommodations.  If you have not heard from our office [phone]) in 5 working days, call the office and ask for your surgeon's nurse.  If you have other questions about your diagnosis, plan, or surgery, call the office and ask for your surgeon's nurse.  Pt Education - [facility] Breast Biopsy HCI: discussed with patient and provided information.

## 2018-07-27 ENCOUNTER — Encounter (HOSPITAL_COMMUNITY): Payer: Self-pay

## 2018-07-27 ENCOUNTER — Ambulatory Visit (HOSPITAL_COMMUNITY)
Admission: EM | Admit: 2018-07-27 | Discharge: 2018-07-27 | Disposition: A | Discharge status: Home / Self Care | Payer: BLUE CROSS/BLUE SHIELD | Attending: Family Medicine | Admitting: Family Medicine

## 2018-07-27 DIAGNOSIS — J22 Unspecified acute lower respiratory infection: Secondary | ICD-10-CM | POA: Diagnosis not present

## 2018-07-27 MED ORDER — AZITHROMYCIN 250 MG PO TABS: ORAL_TABLET | ORAL | 0 refills | Status: DC

## 2018-07-27 MED ORDER — BENZONATATE 200 MG PO CAPS: 200.0000 mg | ORAL_CAPSULE | Freq: Two times a day (BID) | ORAL | 0 refills | Status: DC | PRN

## 2018-07-27 NOTE — Discharge Instructions (Signed)
Rest Drink plenty fluids Use a humidifier if you have one Ibuprofen for pain and fever Take the Zithromax as directed Tessalon 2 times a day for cough Expect improvement over the next several days Follow-up if you are worse at any time instead of better

## 2018-07-27 NOTE — ED Triage Notes (Signed)
Pt presents with cough with minimal mucus, off  & on fever, and ear pain.

## 2018-07-27 NOTE — ED Provider Notes (Signed)
[facility]    CSN: [id number] Arrival date & time: 07/27/18  1932     History   Chief Complaint Chief Complaint  Patient presents with  . Fever  . Otalgia  . Cough    HPI [name] is a 38 y.o. female.   HPI  Patient's had a cold for 7 days.  Cough cold runny nose clear rhinorrhea.  She is been managing this with over-the-counter medicines.  Yesterday she started having sweats and chills, body aches, and ear pain.  She did not take her temperature.  She has more coughing at night with some sputum production.  No shortness of breath.  No chest pain.  No nausea or vomiting.  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  Past Medical History:  Diagnosis Date  . Anxiety   . Asthma   . GERD (gastroesophageal reflux disease)   . Panic attack   . PTSD (post-traumatic stress disorder)     Patient Active Problem List   Diagnosis Date Noted  . 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 OBESITY, NOS 09/22/2006  . DEPRESSION, MAJOR, RECURRENT 09/22/2006  . RHINITIS, ALLERGIC 09/22/2006  . 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 IRRITABLE BOWEL SYNDROME 09/22/2006    Past Surgical History:  Procedure Laterality Date  . ADENOIDECTOMY    . CESAREAN SECTION    . lymph node disection    . MYRINGOTOMY    . reduction of turbinates    . TONSILLECTOMY      OB History    Gravida  5   Para  4   Term  4   Preterm      AB  1   Living        SAB  1   TAB      Ectopic      Multiple      Live Births  4            Home Medications    Prior to Admission medications   Medication Sig Start Date End Date Taking? Authorizing Provider  acetaminophen (TYLENOL) 500 MG tablet Take 1,000 mg by mouth every 6 (six) hours as needed for mild pain or headache.    [provider]  albuterol (PROVENTIL HFA;VENTOLIN HFA) 108 (90 BASE) MCG/ACT inhaler Inhale 1 puff into the lungs every 6 (six) hours as needed for  wheezing or shortness of breath.    [provider]  azithromycin (ZITHROMAX Z-PAK) 250 MG tablet Take two pills today followed by one a day until gone 07/27/18   Eustace Moore, MD  benzonatate (TESSALON) 200 MG capsule Take 1 capsule (200 mg total) by mouth 2 (two) times daily as needed for cough. 07/27/18   Eustace Moore, MD  levonorgestrel (MIRENA) 20 MCG/24HR IUD 1 each by Intrauterine route once.    [provider]  Prenatal Vit-Fe Fumarate-FA (PRENATAL MULTIVITAMIN) TABS tablet Take 1 tablet by mouth daily at 12 noon.    [provider]    Family History Family History  Problem Relation Age of Onset  . Hepatitis C Mother   . Hypertension Mother   . Diabetes Mother   . Cancer Mother   . Hepatitis C Father   . Hypertension Father   . Diabetes Other   . Hypertension Other   . Arthritis Other   . Breast cancer Maternal Grandmother     Social History Social History   Tobacco Use  .  Smoking status: Former Smoker    Last attempt to quit: 03/11/2017    Years since quitting: 1.3  . Smokeless tobacco: Former Engineer, water Use Topics  . Alcohol use: No  . Drug use: No     Allergies   Clindamycin/lincomycin; Penicillins; Monistat [miconazole nitrate-wipes]; Neosporin [neomycin-bacitracin zn-polymyx]; and Betadine [povidone iodine]   Review of Systems Review of Systems  Constitutional: Positive for chills, fatigue and fever.  HENT: Positive for congestion and postnasal drip. Negative for ear pain and sore throat.   Eyes: Negative for pain and visual disturbance.  Respiratory: Positive for cough. Negative for shortness of breath.   Cardiovascular: Negative for chest pain and palpitations.  Gastrointestinal: Negative for abdominal pain and vomiting.  Genitourinary: Negative for dysuria and hematuria.  Musculoskeletal: Positive for myalgias. Negative for arthralgias and back pain.  Skin: Negative for color change and rash.  Neurological:  Negative for seizures and syncope.  All other systems reviewed and are negative.    Physical Exam Triage Vital Signs ED Triage Vitals  Enc Vitals Group     BP 07/27/18 2030 126/64     Pulse Rate 07/27/18 2030 88     Resp 07/27/18 2030 18     Temp 07/27/18 2030 98.8 F (37.1 C)     Temp Source 07/27/18 2030 Oral     SpO2 07/27/18 2030 100 %     Weight --      Height --      Head Circumference --      Peak Flow --      Pain Score 07/27/18 2033 6     Pain Loc --      Pain Edu? --      Excl. in GC? --    No data found.  Updated Vital Signs BP 126/64 (BP Location: Right Arm)   Pulse 88   Temp 98.8 F (37.1 C) (Oral)   Resp 18   LMP 07/07/2018   SpO2 100%   Visual Acuity Right Eye Distance:   Left Eye Distance:   Bilateral Distance:    Right Eye Near:   Left Eye Near:    Bilateral Near:     Physical Exam Constitutional:      General: She is not in acute distress.    Appearance: She is well-developed. She is ill-appearing.     Comments: Ill-appearing.  Wrapped up in a blanket.  Very tired  HENT:     Head: Normocephalic and atraumatic.     Right Ear: Tympanic membrane and ear canal normal.     Left Ear: Tympanic membrane and ear canal normal.     Nose: Congestion present.     Mouth/Throat:     Mouth: Mucous membranes are moist.  Eyes:     Conjunctiva/sclera: Conjunctivae normal.     Pupils: Pupils are equal, round, and reactive to light.  Neck:     Musculoskeletal: Normal range of motion.  Cardiovascular:     Rate and Rhythm: Normal rate and regular rhythm.     Heart sounds: Normal heart sounds.  Pulmonary:     Effort: Pulmonary effort is normal. No respiratory distress.     Breath sounds: No rales.     Comments: Few rhonchi centrally. Abdominal:     General: Abdomen is flat. There is no distension.     Palpations: Abdomen is soft.  Musculoskeletal: Normal range of motion.  Skin:    General: Skin is warm and dry.  Neurological:  General: No focal  deficit present.     Mental Status: She is alert. Mental status is at baseline.  Psychiatric:        Mood and Affect: Mood normal.        Thought Content: Thought content normal.      UC Treatments / Results  Labs (all labs ordered are listed, but only abnormal results are displayed) Labs Reviewed - No data to display  EKG None  Radiology No results found.  Procedures Procedures (including critical care time)  Medications Ordered in UC Medications - No data to display  Initial Impression / Assessment and Plan / UC Course  I have reviewed the triage vital signs and the nursing notes.  Pertinent labs & imaging results that were available during my care of the patient were reviewed by me and considered in my medical decision making (see chart for details).     *Difficult to tell if she started off with a cold virus and has a worsening in symptoms, where she started off with a cold virus and now has an influenza.  No known influenza exposure.  She did not have a flu shot. Final Clinical Impressions(s) / UC Diagnoses   Final diagnoses:  LRTI (lower respiratory tract infection)     Discharge Instructions     Rest Drink plenty fluids Use a humidifier if you have one Ibuprofen for pain and fever Take the Zithromax as directed Tessalon 2 times a day for cough Expect improvement over the next several days Follow-up if you are worse at any time instead of better   ED Prescriptions    Medication Sig Dispense Auth. Provider   benzonatate (TESSALON) 200 MG capsule Take 1 capsule (200 mg total) by mouth 2 (two) times daily as needed for cough. 20 capsule Eustace MooreNelson,  Sue, MD   azithromycin (ZITHROMAX Z-PAK) 250 MG tablet Take two pills today followed by one a day until gone 6 tablet Delton SeeNelson, Letta Pate Sue, MD     Controlled Substance Prescriptions [place] Controlled Substance Registry consulted? Not Applicable   Eustace MooreNelson,  Sue, MD 07/27/18 2110

## 2018-07-30 ENCOUNTER — Emergency Department
Admission: EM | Admit: 2018-07-30 | Discharge: 2018-07-30 | Disposition: A | Discharge status: Home / Self Care | Payer: BLUE CROSS/BLUE SHIELD | Attending: Emergency Medicine | Admitting: Emergency Medicine

## 2018-07-30 ENCOUNTER — Other Ambulatory Visit: Payer: Self-pay

## 2018-07-30 ENCOUNTER — Emergency Department: Payer: BLUE CROSS/BLUE SHIELD

## 2018-07-30 DIAGNOSIS — Y998 Other external cause status: Secondary | ICD-10-CM | POA: Insufficient documentation

## 2018-07-30 DIAGNOSIS — Z87891 Personal history of nicotine dependence: Secondary | ICD-10-CM | POA: Insufficient documentation

## 2018-07-30 DIAGNOSIS — S40012A Contusion of left shoulder, initial encounter: Secondary | ICD-10-CM | POA: Insufficient documentation

## 2018-07-30 DIAGNOSIS — J45909 Unspecified asthma, uncomplicated: Secondary | ICD-10-CM | POA: Insufficient documentation

## 2018-07-30 DIAGNOSIS — Y929 Unspecified place or not applicable: Secondary | ICD-10-CM | POA: Diagnosis not present

## 2018-07-30 DIAGNOSIS — F329 Major depressive disorder, single episode, unspecified: Secondary | ICD-10-CM | POA: Diagnosis not present

## 2018-07-30 DIAGNOSIS — Z79899 Other long term (current) drug therapy: Secondary | ICD-10-CM | POA: Insufficient documentation

## 2018-07-30 DIAGNOSIS — F419 Anxiety disorder, unspecified: Secondary | ICD-10-CM | POA: Insufficient documentation

## 2018-07-30 DIAGNOSIS — Y9389 Activity, other specified: Secondary | ICD-10-CM | POA: Insufficient documentation

## 2018-07-30 DIAGNOSIS — S4992XA Unspecified injury of left shoulder and upper arm, initial encounter: Secondary | ICD-10-CM | POA: Diagnosis present

## 2018-07-30 MED ORDER — MELOXICAM 15 MG PO TABS: 15.0000 mg | ORAL_TABLET | Freq: Every day | ORAL | 1 refills | Status: AC

## 2018-07-30 NOTE — ED Notes (Signed)
Pt ambulatory to XR

## 2018-07-30 NOTE — ED Notes (Signed)
:  Pt returned from XR.  Pt c/o Pain to left shoulder since last night, took Motrin around 10:00, no relief with motrin.  Pt states she was up all night due to the pain.
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
## 2019-02-21 DIAGNOSIS — J069 Acute upper respiratory infection, unspecified: Secondary | ICD-10-CM | POA: Diagnosis not present

## 2019-02-22 DIAGNOSIS — Z20828 Contact with and (suspected) exposure to other viral communicable diseases: Secondary | ICD-10-CM | POA: Diagnosis not present

## 2019-02-28 DIAGNOSIS — H9011 Conductive hearing loss, unilateral, right ear, with unrestricted hearing on the contralateral side: Secondary | ICD-10-CM | POA: Diagnosis not present

## 2019-02-28 DIAGNOSIS — H938X3 Other specified disorders of ear, bilateral: Secondary | ICD-10-CM | POA: Diagnosis not present

## 2019-02-28 DIAGNOSIS — H6981 Other specified disorders of Eustachian tube, right ear: Secondary | ICD-10-CM | POA: Diagnosis not present

## 2019-03-29 DIAGNOSIS — Z113 Encounter for screening for infections with a predominantly sexual mode of transmission: Secondary | ICD-10-CM | POA: Diagnosis not present

## 2019-03-29 DIAGNOSIS — N939 Abnormal uterine and vaginal bleeding, unspecified: Secondary | ICD-10-CM | POA: Diagnosis not present

## 2019-03-29 DIAGNOSIS — N941 Unspecified dyspareunia: Secondary | ICD-10-CM | POA: Diagnosis not present

## 2019-03-29 DIAGNOSIS — R102 Pelvic and perineal pain: Secondary | ICD-10-CM | POA: Diagnosis not present

## 2019-04-03 DIAGNOSIS — R102 Pelvic and perineal pain: Secondary | ICD-10-CM | POA: Diagnosis not present

## 2019-04-03 DIAGNOSIS — N946 Dysmenorrhea, unspecified: Secondary | ICD-10-CM | POA: Diagnosis not present

## 2019-05-07 DIAGNOSIS — R102 Pelvic and perineal pain: Secondary | ICD-10-CM | POA: Diagnosis not present

## 2019-06-14 DIAGNOSIS — Z3040 Encounter for surveillance of contraceptives, unspecified: Secondary | ICD-10-CM | POA: Diagnosis not present

## 2019-06-14 DIAGNOSIS — N939 Abnormal uterine and vaginal bleeding, unspecified: Secondary | ICD-10-CM | POA: Diagnosis not present

## 2019-06-14 DIAGNOSIS — N920 Excessive and frequent menstruation with regular cycle: Secondary | ICD-10-CM | POA: Diagnosis not present

## 2019-06-25 ENCOUNTER — Other Ambulatory Visit: Payer: Self-pay

## 2019-06-25 ENCOUNTER — Encounter (HOSPITAL_BASED_OUTPATIENT_CLINIC_OR_DEPARTMENT_OTHER): Payer: Self-pay | Admitting: *Deleted

## 2019-06-25 ENCOUNTER — Other Ambulatory Visit (HOSPITAL_COMMUNITY)
Admission: RE | Admit: 2019-06-25 | Discharge: 2019-06-25 | Disposition: A | Discharge status: Home / Self Care | Payer: BC Managed Care – PPO | Source: Ambulatory Visit | Attending: Obstetrics and Gynecology | Admitting: Obstetrics and Gynecology

## 2019-06-25 ENCOUNTER — Encounter (HOSPITAL_COMMUNITY)
Admission: RE | Admit: 2019-06-25 | Discharge: 2019-06-25 | Disposition: A | Discharge status: Home / Self Care | Payer: BC Managed Care – PPO | Source: Ambulatory Visit | Attending: Obstetrics and Gynecology | Admitting: Obstetrics and Gynecology

## 2019-06-25 DIAGNOSIS — Z01812 Encounter for preprocedural laboratory examination: Secondary | ICD-10-CM | POA: Diagnosis not present

## 2019-06-25 DIAGNOSIS — Z20828 Contact with and (suspected) exposure to other viral communicable diseases: Secondary | ICD-10-CM | POA: Diagnosis not present

## 2019-06-25 LAB — CBC
HCT: 42.6 % (ref 36.0–46.0)
Hemoglobin: 14 g/dL (ref 12.0–15.0)
MCH: 31.8 pg (ref 26.0–34.0)
MCHC: 32.9 g/dL (ref 30.0–36.0)
MCV: 96.8 fL (ref 80.0–100.0)
Platelets: 214 10*3/uL (ref 150–400)
RBC: 4.4 MIL/uL (ref 3.87–5.11)
RDW: 12.1 % (ref 11.5–15.5)
WBC: 5.9 10*3/uL (ref 4.0–10.5)
nRBC: 0 % (ref 0.0–0.2)

## 2019-06-25 LAB — ABO/RH: ABO/RH(D): O POS

## 2019-06-25 LAB — BASIC METABOLIC PANEL
Anion gap: 8 (ref 5–15)
BUN: 12 mg/dL (ref 6–20)
CO2: 25 mmol/L (ref 22–32)
Calcium: 9.1 mg/dL (ref 8.9–10.3)
Chloride: 106 mmol/L (ref 98–111)
Creatinine, Ser: 0.91 mg/dL (ref 0.44–1.00)
GFR calc Af Amer: >60 mL/min (ref >60)
GFR calc non Af Amer: >60 mL/min (ref >60)
Glucose, Bld: 91 mg/dL (ref 70–99)
Potassium: 3.9 mmol/L (ref 3.5–5.1)
Sodium: 139 mmol/L (ref 135–145)

## 2019-06-25 LAB — HCG, SERUM, QUALITATIVE: Preg, Serum: NEGATIVE

## 2019-06-25 NOTE — H&P (Signed)
NAME: [name], [name] MEDICAL RECORD [id number] ACCOUNT [id number] DATE OF BIRTH:04-28-1981 FACILITY: [facility] LOCATION: [facility] PHYSICIAN:Copper Kirtley Lisbeth Ply, MD  HISTORY AND PHYSICAL  DATE OF ADMISSION:  06/28/2019  HISTORY OF PRESENT ILLNESS:  The patient is a 38 year old gravida 5, para 4, abortus 1 female who presents for hysteroscopy with NovaSure ablation, laparoscopic evaluation with laparoscopic bilateral tubal ligation and removal of IUD.  In relation to the present admission, the patient has a Mirena IUD in place.  She is having problems with pain during intercourse and pain during deep penetration, also abnormal bleeding.  She states when she did not have her IUD the bleeding was much  more significant.  She did undergo an ultrasound evaluation that looked like we had proper placement of the IUD.  Uterus and ovaries were unremarkable.  We went over various options.  She was told at one time that she had scarring from her C-sections.   We had treated her with antibiotics in the hope that it might be an endometritis, but she failed to resolve with that and now she presents for the above noted surgery.  ALLERGIES:  SHE IS ALLERGIC TO BETADINE, CLINDAMYCIN, CLOTRIMAZOLE, MONISTAT, NEOSPORIN AND PENICILLINS.  MEDICATIONS:  She received a flu vaccination.  She uses ibuprofen, Montelukast 10 mg tablets, Zyrtec as needed, and fluconazole propionate nasal spray suspension.  PAST MEDICAL HISTORY:  She has a history of irritable bowel syndrome.  She has migraine headaches.  She has anxiety disorder and asthmatic issues.  PAST SURGICAL HISTORY:  She has had 1 cesarean section.  She has had an excision of a lymph node.  She had laparoscopic excisions of pelvic endometriosis.  She also had laparoscopic lysis of adhesions and ear surgery.  SOCIAL HISTORY:  She does smoke 6 cigarettes per day.  No alcohol use.  FAMILY HISTORY:  Noncontributory.  REVIEW OF SYSTEMS:   Noncontributory.  PHYSICAL EXAMINATION: VITAL SIGNS:  The patient is afebrile with stable vital signs. HEENT:  The patient is normocephalic.  Pupils are equal, round and reactive to light and accommodation.  Extraocular movements are intact.  Sclerae and conjunctivae throat clear.  Oropharynx is clear. NECK:  Without thyromegaly. LUNGS:  Clear to auscultation and percussion. CARDIOVASCULAR:  Regular rhythm and rate.  No murmurs or gallops. ABDOMEN:  Benign.  Well-healed low transverse incision.  No masses, organomegaly or tenderness. PELVIC:  Normal external genitalia.  Vaginal mucosa is clear.  Cervix unremarkable.  IUD string noted.  Uterus, normal size, shape and contour, mildly tender.  Adnexa unremarkable. EXTREMITIES:  Trace edema.   NEUROLOGIC:  Grossly within normal limits.  IMPRESSION: 1.  Continued pelvic pain and dyspareunia, rule out pelvic adhesions and/or endometriosis. 2.  Multiparity, desires sterility. 3.  Abnormal uterine bleeding, menorrhagia to undergo endometrial ablation as well as removal of intrauterine device. 4.  Asthma. 5.  Irritable bowel syndrome.  PLAN:  We are going to proceed with diagnostic laparoscopy.  We will do a bilateral tubal ligation at that time.  We then proceeded with hysteroscopy with endometrial ablation, removal of the IUD.  The potential irreversibility of sterilization was  explained.  Failure rate of 1 in 200 is quoted.  Failures can be in the form of ectopic pregnancy requiring further surgical management.  Risk of laparoscopy explained including the risk of infection.  Risk of vascular injury that could lead to  hemorrhage requiring transfusion with the risk of AIDS or hepatitis.  Risk of injury to adjacent organs such as bladder, bowel or  ureters or could require further exploratory surgery.  Risk of deep venous thrombosis and pulmonary embolus.  In terms of  the hysteroscopy with ablation, there is a risk of infection.  Vascular injury  could lead to hemorrhage requiring transfusion or possible hysterectomy.  Risk of bowel injury that could require further exploratory surgery.  In all cases, there is a risk  of deep venous thrombosis and pulmonary embolus.  The patient expressed understanding and indication, risks, and alternatives.  CN/NUANCE  D:06/25/2019 T:06/25/2019 JOB:[id number]

## 2019-06-25 NOTE — Progress Notes (Signed)
Spoke w/ via phone for pre-op interview---Tasneem Lab needs dos----   none           Lab results------cbc, serum pregnancy, bmet, type and screen 06-25-2019 chart/epic COVID test ------06-25-2019 Arrive at -------530 am 06-28-2019 NPO after ------midnight Medications to take morning of surgery -----albuterol inhaler prn and bring inhaler, zyrtec Diabetic medication ----- Patient Special Instructions --n/a--- Pre-Op special Istructions ----- Patient verbalized understanding of instructions that were given at this phone interview. Patient denies shortness of breath, chest pain, fever, cough a this phone interview.

## 2019-06-25 NOTE — H&P (Signed)
Patient name  [name], [name] DICTATION# [id number] CSN# [id number]  Arvella Nigh, MD 06/25/2019 10:05 AM

## 2019-06-26 DIAGNOSIS — Z3040 Encounter for surveillance of contraceptives, unspecified: Secondary | ICD-10-CM | POA: Diagnosis not present

## 2019-06-26 DIAGNOSIS — R102 Pelvic and perineal pain: Secondary | ICD-10-CM | POA: Diagnosis not present

## 2019-06-26 LAB — NOVEL CORONAVIRUS, NAA (HOSP ORDER, SEND-OUT TO REF LAB; TAT 18-24 HRS): SARS-CoV-2, NAA: NOT DETECTED

## 2019-06-27 NOTE — Anesthesia Preprocedure Evaluation (Addendum)
Anesthesia Evaluation  Patient identified by MRN, date of birth, ID band Patient awake    Reviewed: Allergy & Precautions, NPO status , Patient's Chart, lab work & pertinent test results  History of Anesthesia Complications Negative for: history of anesthetic complications  Airway Mallampati: I  TM Distance: >3 FB Neck ROM: Full    Dental  (+) Dental Advisory Given   Pulmonary COPD,  COPD inhaler, former smoker,  06/25/2019 SARS CoV2 NEG   breath sounds clear to auscultation       Cardiovascular negative cardio ROS   Rhythm:Regular Rate:Normal     Neuro/Psych  Headaches, Anxiety Depression    GI/Hepatic Neg liver ROS, GERD  Controlled,  Endo/Other  Morbid obesity  Renal/GU negative Renal ROS     Musculoskeletal   Abdominal (+) + obese,   Peds  Hematology negative hematology ROS (+)   Anesthesia Other Findings   Reproductive/Obstetrics mirena                            Anesthesia Physical Anesthesia Plan  ASA: II  Anesthesia Plan: General   Post-op Pain Management:    Induction: Intravenous  PONV Risk Score and Plan: 3 and Scopolamine patch - Pre-op, Dexamethasone and Ondansetron  Airway Management Planned: Oral ETT  Additional Equipment: None  Intra-op Plan:   Post-operative Plan: Extubation in OR  Informed Consent: I have reviewed the patients History and Physical, chart, labs and discussed the procedure including the risks, benefits and alternatives for the proposed anesthesia with the patient or authorized representative who has indicated his/her understanding and acceptance.     Dental advisory given  Plan Discussed with: CRNA and Surgeon  Anesthesia Plan Comments:         Anesthesia Quick Evaluation

## 2019-06-28 ENCOUNTER — Observation Stay (HOSPITAL_BASED_OUTPATIENT_CLINIC_OR_DEPARTMENT_OTHER): Payer: BC Managed Care – PPO | Admitting: Anesthesiology

## 2019-06-28 ENCOUNTER — Other Ambulatory Visit: Payer: Self-pay

## 2019-06-28 ENCOUNTER — Encounter (HOSPITAL_BASED_OUTPATIENT_CLINIC_OR_DEPARTMENT_OTHER): Payer: Self-pay

## 2019-06-28 ENCOUNTER — Observation Stay (HOSPITAL_BASED_OUTPATIENT_CLINIC_OR_DEPARTMENT_OTHER)
Admission: RE | Admit: 2019-06-28 | Discharge: 2019-06-28 | Disposition: A | Discharge status: Home / Self Care | Payer: BC Managed Care – PPO | Attending: Obstetrics and Gynecology | Admitting: Obstetrics and Gynecology

## 2019-06-28 ENCOUNTER — Encounter (HOSPITAL_BASED_OUTPATIENT_CLINIC_OR_DEPARTMENT_OTHER)
Admission: RE | Disposition: A | Discharge status: Home / Self Care | Payer: Self-pay | Source: Home / Self Care | Attending: Obstetrics and Gynecology

## 2019-06-28 DIAGNOSIS — R102 Pelvic and perineal pain unspecified side: Secondary | ICD-10-CM | POA: Diagnosis present

## 2019-06-28 DIAGNOSIS — Z6834 Body mass index (BMI) 34.0-34.9, adult: Secondary | ICD-10-CM | POA: Insufficient documentation

## 2019-06-28 DIAGNOSIS — J449 Chronic obstructive pulmonary disease, unspecified: Secondary | ICD-10-CM | POA: Insufficient documentation

## 2019-06-28 DIAGNOSIS — N736 Female pelvic peritoneal adhesions (postinfective): Secondary | ICD-10-CM | POA: Diagnosis not present

## 2019-06-28 DIAGNOSIS — Z79899 Other long term (current) drug therapy: Secondary | ICD-10-CM | POA: Diagnosis not present

## 2019-06-28 DIAGNOSIS — Z20828 Contact with and (suspected) exposure to other viral communicable diseases: Secondary | ICD-10-CM | POA: Diagnosis not present

## 2019-06-28 DIAGNOSIS — J309 Allergic rhinitis, unspecified: Secondary | ICD-10-CM | POA: Diagnosis not present

## 2019-06-28 DIAGNOSIS — Z793 Long term (current) use of hormonal contraceptives: Secondary | ICD-10-CM | POA: Insufficient documentation

## 2019-06-28 DIAGNOSIS — E282 Polycystic ovarian syndrome: Secondary | ICD-10-CM | POA: Diagnosis not present

## 2019-06-28 HISTORY — DX: Pelvic and perineal pain: R10.2

## 2019-06-28 HISTORY — PX: LAPAROSCOPY: SHX197

## 2019-06-28 HISTORY — DX: Headache, unspecified: R51.9

## 2019-06-28 HISTORY — DX: Family history of other specified conditions: Z84.89

## 2019-06-28 LAB — TYPE AND SCREEN
ABO/RH(D): O POS
Antibody Screen: NEGATIVE

## 2019-06-28 SURGERY — LAPAROSCOPY, DIAGNOSTIC
Anesthesia: General | Site: Abdomen

## 2019-06-28 MED ORDER — FENTANYL CITRATE (PF) 100 MCG/2ML IJ SOLN
INTRAMUSCULAR | Status: DC | PRN
  Administered 2019-06-28 (×2): 50 ug via INTRAVENOUS

## 2019-06-28 MED ORDER — OXYCODONE HCL 5 MG/5ML PO SOLN
5.0000 mg | Freq: Once | ORAL | Status: AC | PRN
  Filled 2019-06-28: qty 5

## 2019-06-28 MED ORDER — PROMETHAZINE HCL 25 MG/ML IJ SOLN
6.2500 mg | INTRAMUSCULAR | Status: DC | PRN
  Filled 2019-06-28: qty 1

## 2019-06-28 MED ORDER — DEXAMETHASONE SODIUM PHOSPHATE 10 MG/ML IJ SOLN
INTRAMUSCULAR | Status: DC | PRN
  Administered 2019-06-28: 10 mg via INTRAVENOUS

## 2019-06-28 MED ORDER — KETOROLAC TROMETHAMINE 30 MG/ML IJ SOLN
INTRAMUSCULAR | Status: AC
  Filled 2019-06-28: qty 1

## 2019-06-28 MED ORDER — KETOROLAC TROMETHAMINE 30 MG/ML IJ SOLN
INTRAMUSCULAR | Status: DC | PRN
  Administered 2019-06-28: 30 mg via INTRAVENOUS

## 2019-06-28 MED ORDER — LIDOCAINE 2% (20 MG/ML) 5 ML SYRINGE
INTRAMUSCULAR | Status: AC
  Filled 2019-06-28: qty 5

## 2019-06-28 MED ORDER — DEXAMETHASONE SODIUM PHOSPHATE 10 MG/ML IJ SOLN
INTRAMUSCULAR | Status: AC
  Filled 2019-06-28: qty 1

## 2019-06-28 MED ORDER — MIDAZOLAM HCL 2 MG/2ML IJ SOLN
INTRAMUSCULAR | Status: AC
  Filled 2019-06-28: qty 2

## 2019-06-28 MED ORDER — OXYCODONE HCL 5 MG PO TABS
5.0000 mg | ORAL_TABLET | Freq: Once | ORAL | Status: AC | PRN
  Administered 2019-06-28: 5 mg via ORAL
  Filled 2019-06-28: qty 1

## 2019-06-28 MED ORDER — METRONIDAZOLE IN NACL 5-0.79 MG/ML-% IV SOLN
INTRAVENOUS | Status: AC
  Filled 2019-06-28: qty 100

## 2019-06-28 MED ORDER — BUPIVACAINE HCL 0.25 % IJ SOLN
INTRAMUSCULAR | Status: DC | PRN
  Administered 2019-06-28: 10 mL

## 2019-06-28 MED ORDER — ONDANSETRON HCL 4 MG/2ML IJ SOLN
INTRAMUSCULAR | Status: AC
  Filled 2019-06-28: qty 2

## 2019-06-28 MED ORDER — FENTANYL CITRATE (PF) 100 MCG/2ML IJ SOLN
INTRAMUSCULAR | Status: AC
  Filled 2019-06-28: qty 2

## 2019-06-28 MED ORDER — ONDANSETRON HCL 4 MG/2ML IJ SOLN
INTRAMUSCULAR | Status: DC | PRN
  Administered 2019-06-28: 4 mg via INTRAVENOUS

## 2019-06-28 MED ORDER — ROCURONIUM BROMIDE 10 MG/ML (PF) SYRINGE
PREFILLED_SYRINGE | INTRAVENOUS | Status: AC
  Filled 2019-06-28: qty 10

## 2019-06-28 MED ORDER — MIDAZOLAM HCL 2 MG/2ML IJ SOLN
0.5000 mg | Freq: Once | INTRAMUSCULAR | Status: DC | PRN
  Filled 2019-06-28: qty 2

## 2019-06-28 MED ORDER — MIDAZOLAM HCL 5 MG/5ML IJ SOLN
INTRAMUSCULAR | Status: DC | PRN
  Administered 2019-06-28: 2 mg via INTRAVENOUS

## 2019-06-28 MED ORDER — CIPROFLOXACIN IN D5W 400 MG/200ML IV SOLN
400.0000 mg | INTRAVENOUS | Status: AC
  Administered 2019-06-28: 07:00:00 400 mg via INTRAVENOUS
  Filled 2019-06-28: qty 200

## 2019-06-28 MED ORDER — SCOPOLAMINE 1 MG/3DAYS TD PT72
MEDICATED_PATCH | TRANSDERMAL | Status: AC
  Filled 2019-06-28: qty 1

## 2019-06-28 MED ORDER — SCOPOLAMINE 1 MG/3DAYS TD PT72
1.0000 | MEDICATED_PATCH | Freq: Once | TRANSDERMAL | Status: DC
  Administered 2019-06-28: 1.5 mg via TRANSDERMAL
  Filled 2019-06-28: qty 1

## 2019-06-28 MED ORDER — PROPOFOL 10 MG/ML IV BOLUS
INTRAVENOUS | Status: DC | PRN
  Administered 2019-06-28: 180 mg via INTRAVENOUS

## 2019-06-28 MED ORDER — MEPERIDINE HCL 25 MG/ML IJ SOLN
6.2500 mg | INTRAMUSCULAR | Status: DC | PRN
  Filled 2019-06-28: qty 1

## 2019-06-28 MED ORDER — LIDOCAINE 2% (20 MG/ML) 5 ML SYRINGE
INTRAMUSCULAR | Status: DC | PRN
  Administered 2019-06-28: 100 mg via INTRAVENOUS

## 2019-06-28 MED ORDER — ROCURONIUM BROMIDE 50 MG/5ML IV SOSY
PREFILLED_SYRINGE | INTRAVENOUS | Status: DC | PRN
  Administered 2019-06-28: 50 mg via INTRAVENOUS

## 2019-06-28 MED ORDER — PROPOFOL 10 MG/ML IV BOLUS
INTRAVENOUS | Status: AC
  Filled 2019-06-28: qty 40

## 2019-06-28 MED ORDER — OXYCODONE-ACETAMINOPHEN 7.5-325 MG PO TABS: 1.0000 | ORAL_TABLET | ORAL | 0 refills | Status: AC | PRN

## 2019-06-28 MED ORDER — LACTATED RINGERS IV SOLN
INTRAVENOUS | Status: DC
  Administered 2019-06-28: 09:00:00 via INTRAVENOUS
  Administered 2019-06-28: 50 mL/h via INTRAVENOUS
  Filled 2019-06-28: qty 1000

## 2019-06-28 MED ORDER — OXYCODONE HCL 5 MG PO TABS
ORAL_TABLET | ORAL | Status: AC
  Filled 2019-06-28: qty 1

## 2019-06-28 MED ORDER — METRONIDAZOLE IN NACL 5-0.79 MG/ML-% IV SOLN
500.0000 mg | INTRAVENOUS | Status: AC
  Administered 2019-06-28: 500 mg via INTRAVENOUS
  Filled 2019-06-28: qty 100

## 2019-06-28 MED ORDER — CIPROFLOXACIN IN D5W 400 MG/200ML IV SOLN
INTRAVENOUS | Status: AC
  Filled 2019-06-28: qty 200

## 2019-06-28 MED ORDER — SUGAMMADEX SODIUM 200 MG/2ML IV SOLN
INTRAVENOUS | Status: DC | PRN
  Administered 2019-06-28: 190 mg via INTRAVENOUS

## 2019-06-28 MED ORDER — HYDROMORPHONE HCL 1 MG/ML IJ SOLN
0.2500 mg | INTRAMUSCULAR | Status: DC | PRN
  Filled 2019-06-28: qty 0.5

## 2019-06-28 SURGICAL SUPPLY — 40 items
APPLICATOR COTTON TIP 6 STRL (MISCELLANEOUS) IMPLANT
APPLICATOR COTTON TIP 6IN STRL (MISCELLANEOUS) IMPLANT
BAG RETRIEVAL 10MM (BASKET)
CABLE HIGH FREQUENCY MONO STRZ (ELECTRODE) ×2 IMPLANT
CANISTER SUCT 3000ML PPV (MISCELLANEOUS) IMPLANT
CANISTER SUCTION 1200CC (MISCELLANEOUS) IMPLANT
CATH ROBINSON RED A/P 16FR (CATHETERS) ×3 IMPLANT
COVER MAYO STAND STRL (DRAPES) ×3 IMPLANT
COVER WAND RF STERILE (DRAPES) ×3 IMPLANT
DERMABOND ADVANCED (GAUZE/BANDAGES/DRESSINGS) ×2
DERMABOND ADVANCED .7 DNX12 (GAUZE/BANDAGES/DRESSINGS) ×1 IMPLANT
DRSG COVADERM PLUS 2X2 (GAUZE/BANDAGES/DRESSINGS) IMPLANT
DURAPREP 26ML APPLICATOR (WOUND CARE) ×1 IMPLANT
GAUZE 4X4 16PLY RFD (DISPOSABLE) ×3 IMPLANT
GLOVE BIO SURGEON STRL SZ7 (GLOVE) ×6 IMPLANT
GOWN STRL REUS W/TWL XL LVL3 (GOWN DISPOSABLE) ×3 IMPLANT
KIT TURNOVER CYSTO (KITS) ×3 IMPLANT
NEEDLE INSUFFLATION 120MM (ENDOMECHANICALS) ×2 IMPLANT
NS IRRIG 500ML POUR BTL (IV SOLUTION) ×3 IMPLANT
PACK LAPAROSCOPY BASIN (CUSTOM PROCEDURE TRAY) ×3 IMPLANT
PAD OB MATERNITY 4.3X12.25 (PERSONAL CARE ITEMS) ×3 IMPLANT
PAD PREP 24X48 CUFFED NSTRL (MISCELLANEOUS) ×3 IMPLANT
SCISSORS LAP 5X45 EPIX DISP (ENDOMECHANICALS) ×2 IMPLANT
SEALER TISSUE G2 CVD JAW 45CM (ENDOMECHANICALS) IMPLANT
SET IRRIG TUBING LAPAROSCOPIC (IRRIGATION / IRRIGATOR) IMPLANT
SET IRRIG Y TYPE TUR BLADDER L (SET/KITS/TRAYS/PACK) IMPLANT
SET TUBE SMOKE EVAC HIGH FLOW (TUBING) ×3 IMPLANT
SUT VIC AB 3-0 PS2 18 (SUTURE)
SUT VIC AB 3-0 PS2 18XBRD (SUTURE) ×1 IMPLANT
SUT VICRYL 0 ENDOLOOP (SUTURE) IMPLANT
SUT VICRYL 0 UR6 27IN ABS (SUTURE) ×2 IMPLANT
SUT VICRYL 4-0 PS2 18IN ABS (SUTURE) ×2 IMPLANT
SYS BAG RETRIEVAL 10MM (BASKET)
SYSTEM BAG RETRIEVAL 10MM (BASKET) IMPLANT
TOWEL OR 17X26 10 PK STRL BLUE (TOWEL DISPOSABLE) ×4 IMPLANT
TROCAR BALLN 12MMX100 BLUNT (TROCAR) ×2 IMPLANT
TROCAR OPTI TIP 5M 100M (ENDOMECHANICALS) ×3 IMPLANT
TROCAR XCEL DIL TIP R 11M (ENDOMECHANICALS) IMPLANT
WARMER LAPAROSCOPE (MISCELLANEOUS) ×3 IMPLANT
WATER STERILE IRR 500ML POUR (IV SOLUTION) ×1 IMPLANT

## 2019-06-28 NOTE — H&P (Signed)
  History and physical exam unchanged 

## 2019-06-28 NOTE — Anesthesia Postprocedure Evaluation (Signed)
Anesthesia Post Note  Patient: [name]  Procedure(s) Performed: LAPAROSCOPY DIAGNOSTIC LYSIS OF ADHESIONS (N/A Abdomen)     Patient location during evaluation: Phase II Anesthesia Type: General Level of consciousness: awake and alert, patient cooperative and oriented Pain management: pain level controlled Vital Signs Assessment: post-procedure vital signs reviewed and stable Respiratory status: spontaneous breathing, respiratory function stable and nonlabored ventilation Cardiovascular status: blood pressure returned to baseline and stable Postop Assessment: no apparent nausea or vomiting and adequate PO intake Anesthetic complications: no    Last Vitals:  Vitals:   06/28/19 0845 06/28/19 0900  BP: 120/73 107/68  Pulse: 69 63  Resp: 11 16  Temp:    SpO2: 100% 100%    Last Pain:  Vitals:   06/28/19 0935  TempSrc:   PainSc: 4                  Charliee Krenz,E. Naidelyn Parrella

## 2019-06-28 NOTE — Brief Op Note (Signed)
06/28/2019  8:21 AM  PATIENT:  Carroll Sage Junker  38 y.o. female  PRE-OPERATIVE DIAGNOSIS:  PELVIC PAIN  POST-OPERATIVE DIAGNOSIS:  PELVIC PAIN  PROCEDURE:  Procedure(s): LAPAROSCOPY DIAGNOSTIC LYSIS OF ADHESIONS (N/A)  SURGEON:  Surgeon(s) and Role:    * Arvella Nigh, MD - Primary  PHYSICIAN ASSISTANT:   ASSISTANTS: none   ANESTHESIA:   local and general  EBL:  5 mL   BLOOD ADMINISTERED:none  DRAINS: none   LOCAL MEDICATIONS USED:  MARCAINE     SPECIMEN:  No Specimen  DISPOSITION OF SPECIMEN:  N/A  COUNTS:  YES  TOURNIQUET:  * No tourniquets in log *  DICTATION: .Other Dictation: Dictation Number [id number]  PLAN OF CARE: Discharge to home after PACU  PATIENT DISPOSITION:  PACU - hemodynamically stable.   Delay start of Pharmacological VTE agent (>24hrs) due to surgical blood loss or risk of bleeding: not applicable

## 2019-06-28 NOTE — Anesthesia Procedure Notes (Signed)
Procedure Name: Intubation Date/Time: 06/28/2019 7:33 AM Performed by: Bonney Aid, CRNA Pre-anesthesia Checklist: Patient identified, Emergency Drugs available, Suction available and Patient being monitored Patient Re-evaluated:Patient Re-evaluated prior to induction Oxygen Delivery Method: Circle system utilized Preoxygenation: Pre-oxygenation with 100% oxygen Induction Type: IV induction Ventilation: Mask ventilation without difficulty Laryngoscope Size: Mac and 3 Grade View: Grade I Tube type: Oral Tube size: 7.0 mm Number of attempts: 1 Airway Equipment and Method: Stylet and Oral airway Placement Confirmation: ETT inserted through vocal cords under direct vision,  positive ETCO2 and breath sounds checked- equal and bilateral Tube secured with: Tape Dental Injury: Teeth and Oropharynx as per pre-operative assessment

## 2019-06-28 NOTE — Op Note (Signed)
NAME: [name], [name] MEDICAL RECORD [id number] ACCOUNT [id number] DATE OF BIRTH:[birth year] FACILITY: [facility] LOCATION: [facility] PHYSICIAN:Magdalina Whitehead Sherran Needs, MD  OPERATIVE REPORT  DATE OF PROCEDURE:  06/28/2019  PREOPERATIVE DIAGNOSIS:  Pelvic pain.  POSTOPERATIVE DIAGNOSIS:  Some omental adhesions and pelvic adhesions.  OPERATIVE PROCEDURE:  Open laparoscopy with lysis of adhesions.  SURGEON:  Darlyn Chamber, MD  ANESTHESIA:  General endotracheal.  ESTIMATED BLOOD LOSS:  Minimal.  PACKS AND DRAINS:  None.  INTRAOPERATIVE BLOOD REPLACED:  None.  COMPLICATIONS:  None.  INDICATIONS:  The patient is a 38 year old female, initially was scheduled to undergo diagnostic laparoscopy, bilateral tubal ligation, hydrothermal ablation and removal of IUD.  After preoperative discussion, she decided to proceed with laparoscopy and  treatment of any adhesions that might be present.  The risks of the procedure have been discussed as previously dictated.  DESCRIPTION OF PROCEDURE:  The patient was taken to the OR and placed in supine position.  After a satisfactory level of general endotracheal anesthesia was obtained, the patient was placed in the dorsal lithotomy position using the Allen stirrups.   Perineum and vagina cleansed with Hibiclens.  Bladder was emptied by catheterization.  Speculum placed in the vaginal vault.  Cervix grasped with single-tooth tenaculum.  IUD string was noted.  A Kahn cannula was put in place and secured.  Speculum was  then removed.  The patient's abdomen was prepped out with DuraPrep.  After a period of time, she was draped in a sterile field.  A subumbilical incision was made with knife and extended through the subcutaneous tissue.  Fascia was identified and entered  sharply.  Peritoneum was entered with blunt finger pressure.  The open laparoscopic trocar was inserted and properly deployed.  Abdomen inflated with carbon dioxide.  Laparoscope was introduced.   On visualization, she had omental adhesions going down the  left lower abdominal sidewall to the pelvis.  We brought in the scissors and took down some of these adhesions, thus opening the pelvis.  A 5 mm trocar was put in place under direct visualization.  Further omental adhesions were taken down.  At this  point in time, the uterus was elevated.  The uterus really did not have any adhesions and tubes and ovaries were completely unremarkable.  The right lateral gutter was clear.  Upper abdomen including liver and tip of the gallbladder were clear.   We  revisualized the left side.  There were just omental adhesions.  There was no bowel incorporated in the adhesions.  There was no evidence of any injury.  At this point in time, we decided there was nothing further we can do.  I do not think the omental  adhesions were causing her pain, which really had no pelvic processes.  At this point in time, the laparoscope was then removed.  Abdomen was deflated of carbon dioxide.  All trocars were removed.  Subumbilical fascia closed with 2 figure-of-eights of 0  Vicryl.  Skin was closed with interrupted subcuticulars of 4-0 Vicryl.  Suprapubic incision was closed with Dermabond.  The Kahn cannula and single-tooth tenaculum were removed.  The patient was taken out of dorsal lithotomy position.  Once alert and  extubated, transferred to the recovery room in good condition.  Sponge, instrument and needle count report was correct by circulating nurse x2.  VN/NUANCE  D:06/28/2019 T:06/28/2019 JOB:[id number]

## 2019-06-28 NOTE — Discharge Instructions (Signed)
Post Anesthesia Home Care Instructions  Activity: Get plenty of rest for the remainder of the day. A responsible individual must stay with you for 24 hours following the procedure.  For the next 24 hours, DO NOT: -Drive a car -Advertising copywriter -Drink alcoholic beverages -Take any medication unless instructed by your physician -Make any legal decisions or sign important papers.  Meals: Start with liquid foods such as gelatin or soup. Progress to regular foods as tolerated. Avoid greasy, spicy, heavy foods. If nausea and/or vomiting occur, drink only clear liquids until the nausea and/or vomiting subsides. Call your physician if vomiting continues.  Special Instructions/Symptoms: Your throat may feel dry or sore from the anesthesia or the breathing tube placed in your throat during surgery. If this causes discomfort, gargle with warm salt water. The discomfort should disappear within 24 hours.  If you had a scopolamine patch placed behind your ear for the management of post- operative nausea and/or vomiting:  1. The medication in the patch is effective for 72 hours, after which it should be removed.  Wrap patch in a tissue and discard in the trash. Wash hands thoroughly with soap and water. 2. You may remove the patch earlier than 72 hours if you experience unpleasant side effects which may include dry mouth, dizziness or visual disturbances. 3. Avoid touching the patch. Wash your hands with soap and water after contact with the patch.   Laparoscopic Lysis of Abdominal Adhesions, Care After This sheet gives you information about how to care for yourself after your procedure. Your health care provider may also give you more specific instructions. If you have problems or questions, contact your health care provider. What can I expect after the procedure? After the procedure, it is common to have some pain around your incisions. Follow these instructions at home: Incision care   Follow  instructions from your health care provider about how to take care of your incisions. Make sure you: ? Wash your hands with soap and water before you change your bandage (dressing). If soap and water are not available, use hand sanitizer. ? Change your dressing as told by your health care provider. ? Leave stitches (sutures), skin glue, or adhesive strips in place. These skin closures may need to stay in place for 2 weeks or longer. If adhesive strip edges start to loosen and curl up, you may trim the loose edges. Do not remove adhesive strips completely unless your health care provider tells you to do that.  Check your incision areas every day for signs of infection. Check for: ? Redness, swelling, or pain. ? Fluid or blood. ? Warmth. ? Pus or a bad smell.  Do not take baths, swim, or use a hot tub until your health care provider approves. Ask your health care provider if you may take showers. You may only be allowed to take sponge baths. Activity  Do not lift anything that is heavier than 10 lb (4.5 kg), or the limit that you are told, until your health care provider says that it is safe.  Return to your normal activities as told by your health care provider. Ask your health care provider what activities are safe for you.  Do not drive or use heavy machinery while taking prescription pain medicine. General instructions  Take over-the-counter and prescription medicines only as told by your health care provider.  After your procedure, eat only a little at a time at first. Start with liquids. As your appetite improves, gradually return to  eating solid foods.  If you are taking prescription pain medicine, take actions to prevent or treat constipation. Your health care provider may recommend that you: ? Drink enough fluid to keep your urine pale yellow. ? Eat foods that are high in fiber, such as fresh fruits and vegetables, whole grains, and beans. ? Limit foods that are high in fat and  processed sugars, such as fried or sweet foods. ? Take an over-the-counter or prescription medicine for constipation.  Keep all follow-up visits as told by your health care provider. This is important. Contact a health care provider if:  You have a fever or chills.  You have redness, swelling, or pain at the site of your incisions.  You have fluid or blood coming from your incisions.  Your incisions feel warm to the touch.  You have pus or a bad smell coming from your incisions or the dressing.  Your pain gets worse.  You have a cough.  You have nausea and vomiting that does not go away after 3 hours. Get help right away if you have:  Severe pain in your abdomen or your chest.  Shortness of breath.  Nausea or vomiting that is severe or keeps coming back. Summary  After laparoscopic lysis of abdominal adhesions, it is common to have some pain around your incisions.  Check your incision areas every day for signs of infection. Watch for redness, worsening pain, warmth, and drainage.  Be sure to keep all follow-up visits as told by your health care provider. This information is not intended to replace advice given to you by your health care provider. Make sure you discuss any questions you have with your health care provider. Document Released: 11/26/2014 Document Revised: 08/16/2017 Document Reviewed: 06/29/2017 Elsevier Patient Education  2020 [employer].

## 2019-06-28 NOTE — Transfer of Care (Signed)
Immediate Anesthesia Transfer of Care Note  Patient: [name]  Procedure(s) Performed: LAPAROSCOPY DIAGNOSTIC LYSIS OF ADHESIONS (N/A Abdomen)  Patient Location: PACU  Anesthesia Type:General  Level of Consciousness: awake, alert  and oriented  Airway & Oxygen Therapy: Patient Spontanous Breathing and Patient connected to nasal cannula oxygen  Post-op Assessment: Report given to RN  Post vital signs: Reviewed and stable  Last Vitals: 117/69, 88, 16, 100%, 98.1 Vitals Value Taken Time  BP    Temp    Pulse 100 06/28/19 0823  Resp    SpO2 100 % 06/28/19 0823  Vitals shown include unvalidated device data.  Last Pain:  Vitals:   06/28/19 0613  TempSrc: Oral  PainSc: 0-No pain      Patients Stated Pain Goal: 4 (92/11/94 1740)  Complications: No apparent anesthesia complications

## 2019-06-29 ENCOUNTER — Encounter (HOSPITAL_BASED_OUTPATIENT_CLINIC_OR_DEPARTMENT_OTHER): Payer: Self-pay | Admitting: Obstetrics and Gynecology

## 2019-07-04 DIAGNOSIS — B373 Candidiasis of vulva and vagina: Secondary | ICD-10-CM | POA: Diagnosis not present

## 2019-10-25 ENCOUNTER — Ambulatory Visit: Payer: BC Managed Care – PPO

## 2019-11-28 DIAGNOSIS — Z20828 Contact with and (suspected) exposure to other viral communicable diseases: Secondary | ICD-10-CM | POA: Diagnosis not present

## 2019-12-01 DIAGNOSIS — Z720 Tobacco use: Secondary | ICD-10-CM | POA: Diagnosis not present

## 2020-02-24 HISTORY — PX: WISDOM TOOTH EXTRACTION: SHX21

## 2020-03-19 ENCOUNTER — Encounter (HOSPITAL_COMMUNITY): Payer: Self-pay

## 2020-03-19 ENCOUNTER — Other Ambulatory Visit: Payer: Self-pay

## 2020-03-19 ENCOUNTER — Ambulatory Visit (HOSPITAL_COMMUNITY)
Admission: EM | Admit: 2020-03-19 | Discharge: 2020-03-19 | Disposition: A | Discharge status: Home / Self Care | Payer: BC Managed Care – PPO | Attending: Emergency Medicine | Admitting: Emergency Medicine

## 2020-03-19 DIAGNOSIS — L03317 Cellulitis of buttock: Secondary | ICD-10-CM | POA: Diagnosis not present

## 2020-03-19 MED ORDER — DOXYCYCLINE HYCLATE 100 MG PO CAPS: 100.0000 mg | ORAL_CAPSULE | Freq: Two times a day (BID) | ORAL | 0 refills | Status: DC

## 2020-03-19 NOTE — Discharge Instructions (Signed)
Complete course of antibiotics.  Apply warm compresses to promote drainage as able.  If symptoms worsen or do not improve in the next week to return to be seen or to follow up with your PCP.

## 2020-03-19 NOTE — ED Triage Notes (Addendum)
Pt is here with bug bites on her left buttocks that started yesterday, pt has not taken any meds to relieve discomfort.

## 2020-03-19 NOTE — ED Provider Notes (Signed)
[facility]    CSN: [id number] Arrival date & time: 03/19/20  1928      History   Chief Complaint Chief Complaint  Patient presents with   bug bites    HPI [name] is a 39 y.o. female.   [name] presents with complaints of suspected bug bite to left buttock with pain and tenderness. Noted area to left buttocks on Sunday 8/22. Worsening, increased in size and tenderness. Hot to touch today. Painful. No itching. No known drainage. She doesn't know of any specific insect bite in particular.    ROS per HPI, negative if not otherwise mentioned.      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   IRRITABLE BOWEL SYNDROME 09/22/2006    Past Surgical History:  Procedure Laterality Date   ADENOIDECTOMY     CESAREAN SECTION     lap removal of adhesions  2008   removal of endometriosis   LAPAROSCOPY N/A 06/28/2019   Procedure: LAPAROSCOPY DIAGNOSTIC LYSIS OF ADHESIONS;  Surgeon: Richardean Chimera, MD;  Location: [facility];  Service: Gynecology;  Laterality: N/A;   lymph node disection     results benign   MYRINGOTOMY     reduction of turbinates     TONSILLECTOMY      OB History    Gravida  5   Para  4   Term  4   Preterm      AB  1   Living        SAB  1   TAB      Ectopic      Multiple      Live Births  4            Home Medications    Prior to Admission medications   Medication Sig  Start Date End Date Taking? Authorizing Provider  acetaminophen (TYLENOL) 500 MG tablet Take 1,000 mg by mouth every 6 (six) hours as needed for mild pain or headache.    [provider]  albuterol (PROVENTIL HFA;VENTOLIN HFA) 108 (90 BASE) MCG/ACT inhaler Inhale 1 puff into the lungs every 6 (six) hours as needed for wheezing or shortness of breath.    [provider]  cetirizine (ZYRTEC) 10 MG tablet Take 10 mg by mouth daily.    [provider]  doxycycline (VIBRAMYCIN) 100 MG capsule Take 1 capsule (100 mg total) by mouth 2 (two) times daily. 03/19/20   Georgetta Haber, NP  fluticasone (FLONASE) 50 MCG/ACT nasal spray Place 2 sprays into both nostrils daily.    [provider]  levonorgestrel (MIRENA) 20 MCG/24HR IUD 1 each by Intrauterine route once.    [provider]  montelukast (SINGULAIR) 10 MG tablet Take 10 mg by mouth at bedtime.    [provider]    Family History Family History  Problem Relation Age of Onset  Hepatitis C Mother    Hypertension Mother    Diabetes Mother    Cancer Mother    Hepatitis C Father    Hypertension Father    Diabetes Other    Hypertension Other    Arthritis Other    Breast cancer Maternal Grandmother     Social History Social History   Tobacco Use   Smoking status: Former Smoker    Types: Cigarettes   Smokeless tobacco: Never Used   Tobacco comment: quit 04-22-1999 social   Vaping Use   Vaping Use: Never used  Substance Use Topics   Alcohol use: Yes   Drug use: No     Allergies   Clindamycin/lincomycin, Penicillins, Clotrimazole, Monistat [miconazole nitrate-wipes], Neosporin [neomycin-bacitracin zn-polymyx], and Betadine [povidone iodine]   Review of Systems Review of Systems   Physical Exam Triage Vital Signs ED Triage Vitals  Enc Vitals Group     BP 03/19/20 2027 128/77     Pulse Rate 03/19/20 2027 93     Resp 03/19/20 2027 17     Temp 03/19/20 2027  98.6 F (37 C)     Temp Source 03/19/20 2027 Oral     SpO2 03/19/20 2027 100 %     Weight --      Height --      Head Circumference --      Peak Flow --      Pain Score 03/19/20 2024 7     Pain Loc --      Pain Edu? --      Excl. in GC? --    No data found.  Updated Vital Signs BP 128/77 (BP Location: Right Arm)    Pulse 93    Temp 98.6 F (37 C) (Oral)    Resp 17    SpO2 100%   Visual Acuity Right Eye Distance:   Left Eye Distance:   Bilateral Distance:    Right Eye Near:   Left Eye Near:    Bilateral Near:     Physical Exam Constitutional:      General: She is not in acute distress.    Appearance: She is well-developed.  Cardiovascular:     Rate and Rhythm: Normal rate.  Pulmonary:     Effort: Pulmonary effort is normal.  Skin:    General: Skin is warm and dry.          Comments: Approximately 2 cm region of redness and tenderness surrounding central induration, no active drainage, minimal fluctuance  Neurological:     Mental Status: She is alert and oriented to person, place, and time.      UC Treatments / Results  Labs (all labs ordered are listed, but only abnormal results are displayed) Labs Reviewed - No data to display  EKG   Radiology No results found.  Procedures Procedures (including critical care time)  Medications Ordered in UC Medications - No data to display  Initial Impression / Assessment and Plan / UC Course  I have reviewed the triage vital signs and the nursing notes.  Pertinent labs & imaging results that were available during my care of the patient were reviewed by me and considered in my medical decision making (see chart for details).     Small abscess to left buttocks, no active drainage. Warm compresses and antibiotics recommended, no incision indicated today. Return precautions provided. Patient verbalized understanding and agreeable to plan.   Final Clinical Impressions(s) / UC Diagnoses   Final diagnoses:    Cellulitis  of buttock     Discharge Instructions     Complete course of antibiotics.  Apply warm compresses to promote drainage as able.  If symptoms worsen or do not improve in the next week to return to be seen or to follow up with your PCP.      ED Prescriptions    Medication Sig Dispense Auth. Provider   doxycycline (VIBRAMYCIN) 100 MG capsule Take 1 capsule (100 mg total) by mouth 2 (two) times daily. 20 capsule Georgetta Haber, NP     PDMP not reviewed this encounter.   Georgetta Haber, NP 03/20/20 1825

## 2020-05-28 ENCOUNTER — Ambulatory Visit (HOSPITAL_COMMUNITY)
Admission: EM | Admit: 2020-05-28 | Discharge: 2020-05-28 | Disposition: A | Discharge status: Home / Self Care | Payer: BC Managed Care – PPO | Attending: Emergency Medicine | Admitting: Emergency Medicine

## 2020-05-28 ENCOUNTER — Encounter (HOSPITAL_COMMUNITY): Payer: Self-pay

## 2020-05-28 ENCOUNTER — Other Ambulatory Visit: Payer: Self-pay

## 2020-05-28 DIAGNOSIS — Z79899 Other long term (current) drug therapy: Secondary | ICD-10-CM | POA: Diagnosis not present

## 2020-05-28 DIAGNOSIS — R0602 Shortness of breath: Secondary | ICD-10-CM | POA: Diagnosis not present

## 2020-05-28 DIAGNOSIS — J45909 Unspecified asthma, uncomplicated: Secondary | ICD-10-CM | POA: Diagnosis not present

## 2020-05-28 DIAGNOSIS — R059 Cough, unspecified: Secondary | ICD-10-CM | POA: Insufficient documentation

## 2020-05-28 DIAGNOSIS — K219 Gastro-esophageal reflux disease without esophagitis: Secondary | ICD-10-CM | POA: Insufficient documentation

## 2020-05-28 DIAGNOSIS — J22 Unspecified acute lower respiratory infection: Secondary | ICD-10-CM | POA: Insufficient documentation

## 2020-05-28 DIAGNOSIS — Z87891 Personal history of nicotine dependence: Secondary | ICD-10-CM | POA: Diagnosis not present

## 2020-05-28 DIAGNOSIS — Z793 Long term (current) use of hormonal contraceptives: Secondary | ICD-10-CM | POA: Diagnosis not present

## 2020-05-28 DIAGNOSIS — Z20822 Contact with and (suspected) exposure to covid-19: Secondary | ICD-10-CM | POA: Diagnosis not present

## 2020-05-28 MED ORDER — BENZONATATE 200 MG PO CAPS: 200.0000 mg | ORAL_CAPSULE | Freq: Three times a day (TID) | ORAL | 0 refills | Status: AC | PRN

## 2020-05-28 MED ORDER — DOXYCYCLINE HYCLATE 100 MG PO CAPS: 100.0000 mg | ORAL_CAPSULE | Freq: Two times a day (BID) | ORAL | 0 refills | Status: AC

## 2020-05-28 MED ORDER — DM-GUAIFENESIN ER 30-600 MG PO TB12: 1.0000 | ORAL_TABLET | Freq: Two times a day (BID) | ORAL | 0 refills | Status: DC

## 2020-05-28 NOTE — ED Triage Notes (Signed)
Pt presents with nasal congestion, headache, shortness of breath, sweating and cough x 1 week. Shortness of breath on exertion. States she feels the room spinningwhen she try to stand up. Denies body aches, nausea, fever.

## 2020-05-28 NOTE — Discharge Instructions (Signed)
Covid test pending Begin doxycycline twice daily for 1 week Tessalon for cough Mucinex DM twice daily for cough/congestion Rest and fluids Albuterol inhaler as needed Follow-up if not improving or worsening

## 2020-05-28 NOTE — ED Provider Notes (Signed)
[facility]    CSN: [id number] Arrival date & time: 05/28/20  1318      History   Chief Complaint Chief Complaint  Patient presents with   Cough   Nasal Congestion   Shortness of Breath    HPI [name] is a 39 y.o. female history of allergies, asthma, GERD, presenting today for evaluation of cough and congestion.  Patient reports that she has had cough and congestion for approximately 1 week.  Symptoms have worsened recently.  She has had some shortness of breath and dizziness with changing position or exertion.  Denies any GI symptoms.  Denies fever body aches.  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/22/2006   IRRITABLE BOWEL SYNDROME 09/22/2006    Past Surgical History:  Procedure Laterality Date   ADENOIDECTOMY     CESAREAN SECTION     lap removal of adhesions  2008   removal of endometriosis   LAPAROSCOPY N/A 06/28/2019   Procedure: LAPAROSCOPY DIAGNOSTIC LYSIS OF ADHESIONS;  Surgeon: Richardean Chimera, MD;  Location: [facility] ;  Service: Gynecology;  Laterality: N/A;   lymph node disection     results benign   MYRINGOTOMY     reduction of turbinates     TONSILLECTOMY      OB History    Gravida  5   Para  4   Term  4   Preterm      AB  1   Living        SAB  1   TAB      Ectopic      Multiple      Live Births  4            Home Medications    Prior to Admission medications     Medication Sig Start Date End Date Taking? Authorizing Provider  acetaminophen (TYLENOL) 500 MG tablet Take 1,000 mg by mouth every 6 (six) hours as needed for mild pain or headache.    [provider]  albuterol (PROVENTIL HFA;VENTOLIN HFA) 108 (90 BASE) MCG/ACT inhaler Inhale 1 puff into the lungs every 6 (six) hours as needed for wheezing or shortness of breath.    [provider]  benzonatate (TESSALON) 200 MG capsule Take 1 capsule (200 mg total) by mouth 3 (three) times daily as needed for up to 7 days for cough. 05/28/20 06/04/20  Daneya Hartgrove C, PA-C  cetirizine (ZYRTEC) 10 MG tablet Take 10 mg by mouth daily.    [provider]  dextromethorphan-guaiFENesin (MUCINEX DM) 30-600 MG 12hr tablet Take 1 tablet by mouth 2 (two) times daily. 05/28/20   Aasia Peavler C, PA-C  doxycycline (VIBRAMYCIN) 100 MG capsule Take 1 capsule (100 mg total) by mouth 2 (two) times daily for 7 days. 05/28/20 06/04/20  Jersi Mcmaster C, PA-C  fluticasone (FLONASE) 50 MCG/ACT nasal spray Place 2 sprays into  both nostrils daily.    [provider]  levonorgestrel (MIRENA) 20 MCG/24HR IUD 1 each by Intrauterine route once.    [provider]  montelukast (SINGULAIR) 10 MG tablet Take 10 mg by mouth at bedtime.    [provider]    Family History Family History  Problem Relation Age of Onset   Hepatitis C Mother    Hypertension Mother    Diabetes Mother    Cancer Mother    Hepatitis C Father    Hypertension Father    Diabetes Other    Hypertension Other    Arthritis Other    Breast cancer Maternal Grandmother     Social History Social History   Tobacco Use   Smoking status: Former Smoker    Types: Cigarettes   Smokeless tobacco: Never Used   Tobacco comment: quit 04-22-1999 social   Vaping Use   Vaping Use: Never used  Substance Use Topics   Alcohol use: Yes   Drug use: No     Allergies   Clindamycin,  Clindamycin/lincomycin, Penicillins, Sulfa antibiotics, Clotrimazole, Monistat [miconazole nitrate-wipes], Neosporin [neomycin-bacitracin zn-polymyx], Oxybutynin, Betadine [povidone iodine], and Povidone-iodine   Review of Systems Review of Systems  Constitutional: Positive for appetite change. Negative for activity change, chills, fatigue and fever.  HENT: Positive for congestion and rhinorrhea. Negative for ear pain, sinus pressure, sore throat and trouble swallowing.   Eyes: Negative for discharge and redness.  Respiratory: Positive for cough and shortness of breath. Negative for chest tightness.   Cardiovascular: Negative for chest pain.  Gastrointestinal: Negative for abdominal pain, diarrhea, nausea and vomiting.  Musculoskeletal: Negative for myalgias.  Skin: Negative for rash.  Neurological: Positive for dizziness. Negative for light-headedness and headaches.     Physical Exam Triage Vital Signs ED Triage Vitals  Enc Vitals Group     BP      Pulse      Resp      Temp      Temp src      SpO2      Weight      Height      Head Circumference      Peak Flow      Pain Score      Pain Loc      Pain Edu?      Excl. in GC?    No data found.  Updated Vital Signs BP 116/67 (BP Location: Left Arm)    Pulse 84    Temp 99 F (37.2 C) (Oral)    Resp (!) 21    SpO2 96%   Visual Acuity Right Eye Distance:   Left Eye Distance:   Bilateral Distance:    Right Eye Near:   Left Eye Near:    Bilateral Near:     Physical Exam Vitals and nursing note reviewed.  Constitutional:      Appearance: She is well-developed.     Comments: No acute distress  HENT:     Head: Normocephalic and atraumatic.     Ears:     Comments: Bilateral ears without tenderness to palpation of external auricle, tragus and mastoid, EAC's without erythema or swelling, TM's with good bony landmarks and cone of light. Non erythematous.     Nose: Nose normal.     Mouth/Throat:     Comments: Oral mucosa  pink and moist, no tonsillar enlargement or exudate. Posterior pharynx patent and nonerythematous, no uvula deviation or swelling. Normal phonation. Eyes:     Conjunctiva/sclera:  Conjunctivae normal.  Cardiovascular:     Rate and Rhythm: Normal rate.  Pulmonary:     Effort: Pulmonary effort is normal. No respiratory distress.     Comments: Breathing comfortably at rest, CTABL, no wheezing, rales or other adventitious sounds auscultated  Occasional coarse cough Abdominal:     General: There is no distension.  Musculoskeletal:        General: Normal range of motion.     Cervical back: Neck supple.  Skin:    General: Skin is warm and dry.  Neurological:     Mental Status: She is alert and oriented to person, place, and time.      UC Treatments / Results  Labs (all labs ordered are listed, but only abnormal results are displayed) Labs Reviewed  SARS CORONAVIRUS 2 (TAT 6-24 HRS)    EKG   Radiology No results found.  Procedures Procedures (including critical care time)  Medications Ordered in UC Medications - No data to display  Initial Impression / Assessment and Plan / UC Course  I have reviewed the triage vital signs and the nursing notes.  Pertinent labs & imaging results that were available during my care of the patient were reviewed by me and considered in my medical decision making (see chart for details).     Covid test pending, URI symptoms and cough greater than 1 week, treating with doxycycline, Tessalon and Mucinex rest and fluids, continue inhaler as needed.  Push fluids.  Discussed strict return precautions. Patient verbalized understanding and is agreeable with plan.  Final Clinical Impressions(s) / UC Diagnoses   Final diagnoses:  Lower respiratory infection (e.g., bronchitis, pneumonia, pneumonitis, pulmonitis)     Discharge Instructions     Covid test pending Begin doxycycline twice daily for 1 week Tessalon for cough Mucinex DM twice daily  for cough/congestion Rest and fluids Albuterol inhaler as needed Follow-up if not improving or worsening    ED Prescriptions    Medication Sig Dispense Auth. Provider   doxycycline (VIBRAMYCIN) 100 MG capsule Take 1 capsule (100 mg total) by mouth 2 (two) times daily for 7 days. 14 capsule Keshun Berrett C, PA-C   benzonatate (TESSALON) 200 MG capsule Take 1 capsule (200 mg total) by mouth 3 (three) times daily as needed for up to 7 days for cough. 28 capsule Kavaughn Faucett C, PA-C   dextromethorphan-guaiFENesin (MUCINEX DM) 30-600 MG 12hr tablet Take 1 tablet by mouth 2 (two) times daily. 20 tablet Ladesha Pacini, Tucker C, PA-C     PDMP not reviewed this encounter.   Lew Dawes, [place] 05/28/20 1412

## 2020-05-29 LAB — SARS CORONAVIRUS 2 (TAT 6-24 HRS): SARS Coronavirus 2: NEGATIVE

## 2020-08-16 ENCOUNTER — Emergency Department (HOSPITAL_COMMUNITY)
Admission: EM | Admit: 2020-08-16 | Discharge: 2020-08-17 | Disposition: A | Discharge status: Home / Self Care | Payer: BC Managed Care – PPO | Attending: Emergency Medicine | Admitting: Emergency Medicine

## 2020-08-16 ENCOUNTER — Other Ambulatory Visit: Payer: Self-pay

## 2020-08-16 ENCOUNTER — Emergency Department (HOSPITAL_COMMUNITY): Payer: BC Managed Care – PPO

## 2020-08-16 ENCOUNTER — Encounter (HOSPITAL_COMMUNITY): Payer: Self-pay

## 2020-08-16 DIAGNOSIS — L03115 Cellulitis of right lower limb: Secondary | ICD-10-CM | POA: Insufficient documentation

## 2020-08-16 DIAGNOSIS — Z87891 Personal history of nicotine dependence: Secondary | ICD-10-CM | POA: Diagnosis not present

## 2020-08-16 DIAGNOSIS — J45909 Unspecified asthma, uncomplicated: Secondary | ICD-10-CM | POA: Insufficient documentation

## 2020-08-16 DIAGNOSIS — L02415 Cutaneous abscess of right lower limb: Secondary | ICD-10-CM | POA: Insufficient documentation

## 2020-08-16 DIAGNOSIS — L0291 Cutaneous abscess, unspecified: Secondary | ICD-10-CM

## 2020-08-16 DIAGNOSIS — M79604 Pain in right leg: Secondary | ICD-10-CM | POA: Diagnosis not present

## 2020-08-16 MED ORDER — LIDOCAINE HCL (PF) 1 % IJ SOLN
5.0000 mL | Freq: Once | INTRAMUSCULAR | Status: AC
  Administered 2020-08-16: 5 mL
  Filled 2020-08-16: qty 30

## 2020-08-16 NOTE — ED Triage Notes (Signed)
Pt reports she noticed a blister-like bump to right shin on Tuesday. Pt states the bump ruptured on Thursday and became painful. Pt now endorses pain when bearing weight on her right leg.

## 2020-08-16 NOTE — ED Provider Notes (Signed)
[facility]-EMERGENCY DEPT [facility] Emergency Department Provider Note MRN:  [id number]  Arrival date & time: 08/17/20     Chief Complaint   Leg Pain   History of Present Illness   [name] is a 40 y.o. year-old female with a history of asthma presenting to the ED with chief complaint of leg pain.  Patient noticed a pustule on her right mid shin a few days ago, it began oozing some purulent material.  It seems to have gotten worse since then, getting bigger, more red, more painful.  Pain worse when trying to ambulate on the leg.  Denies fever, no other complaints.  Review of Systems  A complete 10 system review of systems was obtained and all systems are negative except as noted in the HPI and PMH.   Patient's Health History    Past Medical History:  Diagnosis Date  . Anxiety   . Asthma   . Family history of adverse reaction to anesthesia    oldest son hyper with anesthesia at age 82  . GERD (gastroesophageal reflux disease)   . Headache    migraine  . Panic attack   . Pelvic pain   . PTSD (post-traumatic stress disorder)     Past Surgical History:  Procedure Laterality Date  . ADENOIDECTOMY    . CESAREAN SECTION    . lap removal of adhesions  2008   removal of endometriosis  . LAPAROSCOPY N/A 06/28/2019   Procedure: LAPAROSCOPY DIAGNOSTIC LYSIS OF ADHESIONS;  Surgeon: Richardean Chimera, MD;  Location: [facility];  Service: Gynecology;  Laterality: N/A;  . lymph node disection     results benign  . MYRINGOTOMY    . reduction of turbinates    . TONSILLECTOMY      Family History  Problem Relation Age of Onset  . Hepatitis C Mother   . Hypertension Mother   . Diabetes Mother   . Cancer Mother   . Hepatitis C Father   . Hypertension Father   . Diabetes Other   . Hypertension Other   . Arthritis Other   . Breast cancer Maternal Grandmother     Social History   Socioeconomic History  . Marital status: Divorced    Spouse name: Not on file   . Number of children: Not on file  . Years of education: Not on file  . Highest education level: Not on file  Occupational History  . Not on file  Tobacco Use  . Smoking status: Former Smoker    Types: Cigarettes  . Smokeless tobacco: Never Used  . Tobacco comment: quit 04-22-1999 social   Vaping Use  . Vaping Use: Never used  Substance and Sexual Activity  . Alcohol use: Yes  . Drug use: No  . Sexual activity: Yes    Birth control/protection: I.U.D., None  Other Topics Concern  . Not on file  Social History Narrative  . Not on file   Social Determinants of Health   Financial Resource Strain: Not on file  Food Insecurity: Not on file  Transportation Needs: Not on file  Physical Activity: Not on file  Stress: Not on file  Social Connections: Not on file  Intimate Partner Violence: Not on file     Physical Exam   Vitals:   08/16/20 2236  BP: (!) 158/102  Pulse: 92  Resp: 18  Temp: 98.6 F (37 C)  SpO2: 96%    CONSTITUTIONAL: Well-appearing, NAD NEURO:  Alert and oriented x 3, no  focal deficits EYES:  eyes equal and reactive ENT/NECK:  no LAD, no JVD CARDIO: Regular rate, well-perfused, normal S1 and S2 PULM:  CTAB no wheezing or rhonchi GI/GU:  normal bowel sounds, non-distended, non-tender MSK/SPINE:  No gross deformities, no edema SKIN: Circular raised erythematous nodule to the right mid shin PSYCH:  Appropriate speech and behavior  *Additional and/or pertinent findings included in MDM below  Diagnostic and Interventional Summary    EKG Interpretation  Date/Time:    Ventricular Rate:    PR Interval:    QRS Duration:   QT Interval:    QTC Calculation:   R Axis:     Text Interpretation:        Labs Reviewed - No data to display  DG Tibia/Fibula Right  Final Result      Medications  lidocaine (PF) (XYLOCAINE) 1 % injection 5 mL (5 mLs Infiltration Given by Other 08/16/20 2341)     Procedures  /  Critical Care .Marland KitchenIncision and  Drainage  Date/Time: 08/17/2020 12:10 AM Performed by: Sabas Sous, MD Authorized by: Sabas Sous, MD   Consent:    Consent obtained:  Verbal   Consent given by:  Patient   Risks, benefits, and alternatives were discussed: yes     Risks discussed:  Bleeding, damage to other organs, incomplete drainage, infection and pain Universal protocol:    Procedure explained and questions answered to patient or proxy's satisfaction: yes     Patient identity confirmed:  Verbally with patient Location:    Type:  Abscess   Size:  1cm   Location:  Lower extremity   Lower extremity location:  Leg   Leg location:  R lower leg Pre-procedure details:    Skin preparation:  Chlorhexidine Anesthesia:    Anesthesia method:  Local infiltration   Local anesthetic:  Lidocaine 1% w/o epi Procedure type:    Complexity:  Complex Procedure details:    Incision types:  Cruciate   Incision depth:  Submucosal   Wound management:  Irrigated with saline   Drainage:  Bloody   Drainage amount:  Scant   Wound treatment:  Wound left open   Packing materials:  None Post-procedure details:    Procedure completion:  Tolerated well, no immediate complications    ED Course and Medical Decision Making  I have reviewed the triage vital signs, the nursing notes, and pertinent available records from the EMR.  Listed above are laboratory and imaging tests that I personally ordered, reviewed, and interpreted and then considered in my medical decision making (see below for details).  Exam consistent with abscess with surrounding cellulitis, given the pain with ambulation screening x-ray performed, no evidence of osteomyelitis or other bony abnormality.  Appropriate for I&D and outpatient antibiotics.       Elmer Sow. Pilar Plate, MD [facility] Emergency Medicine [facility] [email or web address]  Final Clinical Impressions(s) / ED Diagnoses     ICD-10-CM   1. Abscess  L02.91   2. Cellulitis of  right lower extremity  L03.115     ED Discharge Orders         Ordered    doxycycline (VIBRAMYCIN) 100 MG capsule  2 times daily        08/17/20 0009           Discharge Instructions Discussed with and Provided to Patient:     Discharge Instructions     You were evaluated in the Emergency Department and after careful evaluation, we did not  find any emergent condition requiring admission or further testing in the hospital.  Your exam/testing today was overall reassuring.  Symptoms seem to be due to an abscess with surrounding cellulitis.  Please take the doxycycline as directed, use Tylenol or Motrin for discomfort.  Please return to the Emergency Department if you experience any worsening of your condition.  Thank you for allowing [place] to be a part of your care.        Sabas Sous, MD 08/17/20 [phone]

## 2020-08-17 MED ORDER — DOXYCYCLINE HYCLATE 100 MG PO CAPS: 100.0000 mg | ORAL_CAPSULE | Freq: Two times a day (BID) | ORAL | 0 refills | Status: AC

## 2020-08-17 NOTE — Discharge Instructions (Addendum)
You were evaluated in the Emergency Department and after careful evaluation, we did not find any emergent condition requiring admission or further testing in the hospital.  Your exam/testing today was overall reassuring.  Symptoms seem to be due to an abscess with surrounding cellulitis.  Please take the doxycycline as directed, use Tylenol or Motrin for discomfort.  Please return to the Emergency Department if you experience any worsening of your condition.  Thank you for allowing [place] to be a part of your care.

## 2020-11-08 DIAGNOSIS — Z20822 Contact with and (suspected) exposure to covid-19: Secondary | ICD-10-CM | POA: Diagnosis not present

## 2020-11-19 ENCOUNTER — Emergency Department (HOSPITAL_BASED_OUTPATIENT_CLINIC_OR_DEPARTMENT_OTHER): Payer: BC Managed Care – PPO

## 2020-11-19 ENCOUNTER — Other Ambulatory Visit: Payer: Self-pay

## 2020-11-19 ENCOUNTER — Emergency Department (HOSPITAL_BASED_OUTPATIENT_CLINIC_OR_DEPARTMENT_OTHER)
Admission: EM | Admit: 2020-11-19 | Discharge: 2020-11-20 | Disposition: A | Discharge status: Home / Self Care | Payer: BC Managed Care – PPO | Attending: Emergency Medicine | Admitting: Emergency Medicine

## 2020-11-19 ENCOUNTER — Encounter (HOSPITAL_BASED_OUTPATIENT_CLINIC_OR_DEPARTMENT_OTHER): Payer: Self-pay

## 2020-11-19 DIAGNOSIS — R079 Chest pain, unspecified: Secondary | ICD-10-CM | POA: Diagnosis not present

## 2020-11-19 DIAGNOSIS — K8020 Calculus of gallbladder without cholecystitis without obstruction: Secondary | ICD-10-CM | POA: Diagnosis not present

## 2020-11-19 DIAGNOSIS — M25511 Pain in right shoulder: Secondary | ICD-10-CM | POA: Diagnosis not present

## 2020-11-19 DIAGNOSIS — Z87891 Personal history of nicotine dependence: Secondary | ICD-10-CM | POA: Diagnosis not present

## 2020-11-19 DIAGNOSIS — R0789 Other chest pain: Secondary | ICD-10-CM | POA: Insufficient documentation

## 2020-11-19 DIAGNOSIS — J45909 Unspecified asthma, uncomplicated: Secondary | ICD-10-CM | POA: Diagnosis not present

## 2020-11-19 LAB — COMPREHENSIVE METABOLIC PANEL
ALT: 11 U/L (ref 0–44)
AST: 20 U/L (ref 15–41)
Albumin: 4.1 g/dL (ref 3.5–5.0)
Alkaline Phosphatase: 35 U/L — ABNORMAL LOW (ref 38–126)
Anion gap: 12 (ref 5–15)
BUN: 11 mg/dL (ref 6–20)
CO2: 24 mmol/L (ref 22–32)
Calcium: 9.1 mg/dL (ref 8.9–10.3)
Chloride: 101 mmol/L (ref 98–111)
Creatinine, Ser: 0.89 mg/dL (ref 0.44–1.00)
GFR, Estimated: >60 mL/min (ref >60)
Glucose, Bld: 87 mg/dL (ref 70–99)
Potassium: 3.1 mmol/L — ABNORMAL LOW (ref 3.5–5.1)
Sodium: 137 mmol/L (ref 135–145)
Total Bilirubin: 0.7 mg/dL (ref 0.3–1.2)
Total Protein: 7.2 g/dL (ref 6.5–8.1)

## 2020-11-19 LAB — CBC WITH DIFFERENTIAL/PLATELET
Abs Immature Granulocytes: 0.02 10*3/uL (ref 0.00–0.07)
Basophils Absolute: 0 10*3/uL (ref 0.0–0.1)
Basophils Relative: 1 %
Eosinophils Absolute: 0.1 10*3/uL (ref 0.0–0.5)
Eosinophils Relative: 1 %
HCT: 41.6 % (ref 36.0–46.0)
Hemoglobin: 13.9 g/dL (ref 12.0–15.0)
Immature Granulocytes: 0 %
Lymphocytes Relative: 36 %
Lymphs Abs: 3.1 10*3/uL (ref 0.7–4.0)
MCH: 31.5 pg (ref 26.0–34.0)
MCHC: 33.4 g/dL (ref 30.0–36.0)
MCV: 94.3 fL (ref 80.0–100.0)
Monocytes Absolute: 0.6 10*3/uL (ref 0.1–1.0)
Monocytes Relative: 7 %
Neutro Abs: 4.8 10*3/uL (ref 1.7–7.7)
Neutrophils Relative %: 55 %
Platelets: 225 10*3/uL (ref 150–400)
RBC: 4.41 MIL/uL (ref 3.87–5.11)
RDW: 12.5 % (ref 11.5–15.5)
WBC: 8.6 10*3/uL (ref 4.0–10.5)
nRBC: 0 % (ref 0.0–0.2)

## 2020-11-19 LAB — D-DIMER, QUANTITATIVE: D-Dimer, Quant: <0.27 ug/mL-FEU (ref 0.00–0.50)

## 2020-11-19 LAB — TROPONIN I (HIGH SENSITIVITY)
Troponin I (High Sensitivity): 2 ng/L (ref <18)
Troponin I (High Sensitivity): 3 ng/L (ref <18)

## 2020-11-19 LAB — PREGNANCY, URINE: Preg Test, Ur: NEGATIVE

## 2020-11-19 LAB — LIPASE, BLOOD: Lipase: 23 U/L (ref 11–51)

## 2020-11-19 MED ORDER — HYDROMORPHONE HCL 1 MG/ML IJ SOLN
0.5000 mg | Freq: Once | INTRAMUSCULAR | Status: AC
  Administered 2020-11-19: 0.5 mg via INTRAVENOUS
  Filled 2020-11-19: qty 1

## 2020-11-19 MED ORDER — NAPROXEN 500 MG PO TABS: 500.0000 mg | ORAL_TABLET | Freq: Two times a day (BID) | ORAL | 0 refills | Status: DC

## 2020-11-19 MED ORDER — ONDANSETRON HCL 4 MG/2ML IJ SOLN
4.0000 mg | Freq: Once | INTRAMUSCULAR | Status: AC
  Administered 2020-11-19: 4 mg via INTRAVENOUS
  Filled 2020-11-19: qty 2

## 2020-11-19 NOTE — Discharge Instructions (Signed)
Take the Naprosyn as directed for the next 7 days.  Return for any new or worse symptoms.  Extensive work-up here tonight without any acute findings.  Make an appointment to follow-up with the wellness clinic.

## 2020-11-19 NOTE — ED Provider Notes (Signed)
[facility] EMERGENCY DEPARTMENT Provider Note   CSN: [id number] Arrival date & time: 11/19/20  1545     History Chief Complaint  Patient presents with  . Shoulder Pain    [name] is a 40 y.o. female.  Patient presenting with a complaint of acute right shoulder blade pain at about 830 this morning.  Also on the right chest area.  It has been constant.  Associated with some nausea but no vomiting.  No known injury.  Patient can raise her hand to the top of her head.  There is no numbness or weakness to the arm.  No shortness of breath.  No prior history of anything similar.  Past medical history significant for some asthma anxiety posttraumatic stress disorder.  And gastroesophageal reflux disease.        Past Medical History:  Diagnosis Date  . Anxiety   . Asthma   . Family history of adverse reaction to anesthesia    oldest son hyper with anesthesia at age 24  . GERD (gastroesophageal reflux disease)   . Headache    migraine  . Panic attack   . Pelvic pain   . PTSD (post-traumatic stress disorder)     Patient Active Problem List   Diagnosis Date Noted  . Pelvic pain 06/28/2019  . 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 OBESITY, NOS 09/22/2006  . DEPRESSION, MAJOR, RECURRENT 09/22/2006  . RHINITIS, ALLERGIC 09/22/2006  . 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 IRRITABLE BOWEL SYNDROME 09/22/2006    Past Surgical History:  Procedure Laterality Date  . ADENOIDECTOMY    . CESAREAN SECTION    . lap removal of adhesions  2008   removal of endometriosis  . LAPAROSCOPY N/A 06/28/2019   Procedure: LAPAROSCOPY DIAGNOSTIC LYSIS OF ADHESIONS;  Surgeon: Richardean Chimera, MD;  Location: [facility] [place];  Service: Gynecology;  Laterality: N/A;  . lymph node disection     results benign  . MYRINGOTOMY    . reduction of turbinates    . TONSILLECTOMY       OB History    Gravida  5   Para  4   Term  4   Preterm      AB  1   Living        SAB  1   IAB       Ectopic      Multiple      Live Births  4           Family History  Problem Relation Age of Onset  . Hepatitis C Mother   . Hypertension Mother   . Diabetes Mother   . Cancer Mother   . Hepatitis C Father   . Hypertension Father   . Diabetes Other   . Hypertension Other   . Arthritis Other   . Breast cancer Maternal Grandmother     Social History   Tobacco Use  . Smoking status: Former Smoker    Types: Cigarettes  . Smokeless tobacco: Never Used  . Tobacco comment: quit 04-22-1999 social   Vaping Use  . Vaping Use: Never used  Substance Use Topics  . Alcohol use: Yes    Comment: occ  . Drug use: No    Home Medications Prior to Admission medications   Medication Sig Start Date End Date Taking? Authorizing Provider  acetaminophen (TYLENOL) 500 MG tablet Take 1,000 mg by mouth every 6 (six) hours as needed for mild pain or headache.    [provider]  albuterol (PROVENTIL HFA;VENTOLIN HFA) 108 (90 BASE) MCG/ACT inhaler Inhale 1 puff into the lungs every 6 (six) hours as needed for wheezing or shortness of breath.    [provider]  cetirizine (ZYRTEC) 10 MG tablet Take 10 mg by mouth daily.    [provider]  dextromethorphan-guaiFENesin (MUCINEX DM) 30-600 MG 12hr tablet Take 1 tablet by mouth 2 (two) times daily. 05/28/20   Wieters, Hallie C, PA-C  fluticasone (FLONASE) 50 MCG/ACT nasal spray Place 2 sprays into both nostrils daily.    [provider]  levonorgestrel (MIRENA) 20 MCG/24HR IUD 1 each by Intrauterine route once.    [provider]  montelukast (SINGULAIR) 10 MG tablet Take 10 mg by mouth at bedtime.    [provider]    Allergies    Clindamycin, Clindamycin/lincomycin, Penicillins, Sulfa antibiotics, Clotrimazole, Monistat [miconazole nitrate-wipes], Neosporin [neomycin-bacitracin zn-polymyx], Oxybutynin, Betadine [povidone iodine], and Povidone-iodine  Review of Systems   Review of  Systems  Constitutional: Negative for chills and fever.  HENT: Negative for rhinorrhea and sore throat.   Eyes: Negative for visual disturbance.  Respiratory: Negative for cough and shortness of breath.   Cardiovascular: Positive for chest pain. Negative for leg swelling.  Gastrointestinal: Positive for nausea. Negative for abdominal pain, diarrhea and vomiting.  Genitourinary: Negative for dysuria.  Musculoskeletal: Negative for back pain and neck pain.  Skin: Negative for rash.  Neurological: Negative for dizziness, light-headedness and headaches.  Hematological: Does not bruise/bleed easily.  Psychiatric/Behavioral: Negative for confusion.    Physical Exam Updated Vital Signs BP (!) 91/48   Pulse (!) 56   Temp 98.2 F (36.8 C) (Oral)   Resp 18   Ht 1.626 m (5\' 4" )   Wt 93 kg   LMP 11/09/2020   SpO2 99%   BMI 35.19 kg/m   Physical Exam Vitals and nursing note reviewed.  Constitutional:      General: She is not in acute distress.    Appearance: Normal appearance. She is well-developed.  HENT:     Head: Normocephalic and atraumatic.     Mouth/Throat:     Mouth: Mucous membranes are moist.  Eyes:     Extraocular Movements: Extraocular movements intact.     Conjunctiva/sclera: Conjunctivae normal.     Pupils: Pupils are equal, round, and reactive to light.  Cardiovascular:     Rate and Rhythm: Normal rate and regular rhythm.     Heart sounds: No murmur heard.   Pulmonary:     Effort: Pulmonary effort is normal. No respiratory distress.     Breath sounds: Normal breath sounds. No wheezing, rhonchi or rales.  Chest:     Chest wall: No tenderness.  Abdominal:     Palpations: Abdomen is soft.     Tenderness: There is no abdominal tenderness.  Musculoskeletal:        General: No swelling, tenderness, deformity or signs of injury.     Cervical back: Normal range of motion and neck supple. No rigidity.  Skin:    General: Skin is warm and dry.     Capillary Refill:  Capillary refill takes less than 2 seconds.  Neurological:     General: No focal deficit present.     Mental Status: She is alert and oriented to person, place, and time.     Cranial Nerves: No cranial nerve deficit.     Sensory: No sensory deficit.     ED Results / Procedures / Treatments   Labs (  all labs ordered are listed, but only abnormal results are displayed) Labs Reviewed  COMPREHENSIVE METABOLIC PANEL - Abnormal; Notable for the following components:      Result Value   Potassium 3.1 (*)    Alkaline Phosphatase 35 (*)    All other components within normal limits  CBC WITH DIFFERENTIAL/PLATELET  LIPASE, BLOOD  D-DIMER, QUANTITATIVE  PREGNANCY, URINE  TROPONIN I (HIGH SENSITIVITY)  TROPONIN I (HIGH SENSITIVITY)    EKG None  Radiology DG Chest 2 View  Result Date: 11/19/2020 CLINICAL DATA:  chest pain. EXAM: CHEST - 2 VIEW COMPARISON:  07/07/2016. FINDINGS: The heart size and mediastinal contours are within normal limits. Both lungs are clear. The visualized skeletal structures are unremarkable. IMPRESSION: No active cardiopulmonary disease. Electronically Signed   By: Maudry Mayhew MD   On: 11/19/2020 18:02   DG Shoulder Right  Result Date: 11/19/2020 CLINICAL DATA:  Right shoulder pain, onset this morning. Limited range of motion. EXAM: RIGHT SHOULDER - 2+ VIEW COMPARISON:  None. FINDINGS: There is no evidence of fracture or dislocation. Normal alignment and joint spaces. There is no evidence of arthropathy or other focal bone abnormality. Soft tissues are unremarkable. IMPRESSION: Negative radiographs of the right shoulder. Electronically Signed   By: Narda Rutherford M.D.   On: 11/19/2020 17:08   US Abdomen Limited  Result Date: 11/19/2020 CLINICAL DATA:  Questionable cholelithiasis. Patient with pain to the right shoulder blade area. But no abdominal tenderness. Or pain EXAM: ULTRASOUND ABDOMEN LIMITED RIGHT UPPER QUADRANT COMPARISON:  Abdominal ultrasound  01/10/2014 FINDINGS: Gallbladder: Physiologically distended. No gallstones or wall thickening visualized. No sonographic Murphy sign noted by sonographer. Common bile duct: Diameter: 2 mm, normal. Liver: No focal lesion identified. Within normal limits in parenchymal echogenicity. Portal vein is patent on color Doppler imaging with normal direction of blood flow towards the liver. Other: No right upper quadrant ascites. IMPRESSION: Normal right upper quadrant ultrasound.  No gallstones. Electronically Signed   By: Narda Rutherford M.D.   On: 11/19/2020 22:28    Procedures Procedures   Medications Ordered in ED Medications  ondansetron [facility]) injection 4 mg (4 mg Intravenous Given 11/19/20 2012)  HYDROmorphone (DILAUDID) injection 0.5 mg (0.5 mg Intravenous Given 11/19/20 2012)    ED Course  I have reviewed the triage vital signs and the nursing notes.  Pertinent labs & imaging results that were available during my care of the patient were reviewed by me and considered in my medical decision making (see chart for details).    MDM Rules/Calculators/A&P                          Work-up for the right anterior chest pain and right tip of the shoulder blade pain.  Without any acute findings.  D-dimer not elevated so no concerns about pulmonary embolus.  Troponin normal.  Pregnancy test negative no leukocytosis.  Hemoglobin normal.  Mild hypokalemia with a potassium of 3.1.  LFTs normal.  Lipase normal.  Delta troponin also normal.  Chest x-ray negative x-ray of the right shoulder negative.  Also went and did ultrasound right upper quadrant in case there was cholelithiasis but that was negative.  I think based on this negative work-up symptoms may be musculoskeletal.  Will treat with a trial of Naprosyn.  Have her follow-up with the wellness clinic since she does not have a primary care doctor.  Patient will return for any new or worse symptoms.  Based on exam  there is no evidence of any rotator  cuff injury med effect is really no pain in the shoulder itself.  No evidence of any rash.  To suggest herpes zoster.  Also no numbness or weakness to suggest any kind of radicular kind of pain. Final Clinical Impression(s) / ED Diagnoses Final diagnoses:  None    Rx / DC Orders ED Discharge Orders    None       Vanetta Mulders, MD 11/20/20 0002

## 2020-11-19 NOTE — ED Provider Notes (Signed)
Emergency Medicine Provider Triage Evaluation Note  [name], a 40 y.o. female evaluated in triage.  Pt complains of right shoulder pain, also in right chest. Feels winded. No injury  BP 133/82 (BP Location: Left Arm)   Pulse 70   Temp 98.2 F (36.8 C) (Oral)   Resp 18   Ht 5\' 4"  (1.626 m)   Wt 93 kg   LMP 11/09/2020   SpO2 100%   BMI 35.19 kg/m   Patient is alert, no acute distress, normal work of breathing    Medically screening exam initiated at 5:32 PM. Appropriate orders placed.  [name] was informed that the remainder of the evaluation will be completed by another provider, this initial triage assessment does not replace that evaluation, and the importance of remaining in the ED until their evaluation is complete.       Celestine Bougie, [name] N, PA-C 11/19/20 1733    11/21/20, MD 11/19/20 [phone]

## 2020-11-19 NOTE — ED Triage Notes (Signed)
Pt states she woke this am with pain to right shoulder-denies injury-pain worse with movement-NAD-steady gait

## 2020-12-17 ENCOUNTER — Other Ambulatory Visit: Payer: Self-pay

## 2020-12-17 ENCOUNTER — Encounter (HOSPITAL_COMMUNITY): Payer: Self-pay | Admitting: Medical Oncology

## 2020-12-17 ENCOUNTER — Ambulatory Visit (HOSPITAL_COMMUNITY)
Admission: EM | Admit: 2020-12-17 | Discharge: 2020-12-17 | Disposition: A | Discharge status: Home / Self Care | Payer: BC Managed Care – PPO | Attending: Medical Oncology | Admitting: Medical Oncology

## 2020-12-17 DIAGNOSIS — Z789 Other specified health status: Secondary | ICD-10-CM

## 2020-12-17 DIAGNOSIS — K0889 Other specified disorders of teeth and supporting structures: Secondary | ICD-10-CM | POA: Diagnosis not present

## 2020-12-17 DIAGNOSIS — Z889 Allergy status to unspecified drugs, medicaments and biological substances status: Secondary | ICD-10-CM

## 2020-12-17 DIAGNOSIS — R509 Fever, unspecified: Secondary | ICD-10-CM | POA: Diagnosis not present

## 2020-12-17 MED ORDER — NAPROXEN 500 MG PO TABS: 500.0000 mg | ORAL_TABLET | Freq: Two times a day (BID) | ORAL | 0 refills | Status: DC

## 2020-12-17 MED ORDER — METRONIDAZOLE 500 MG PO TABS: 500.0000 mg | ORAL_TABLET | Freq: Three times a day (TID) | ORAL | 0 refills | Status: AC

## 2020-12-17 MED ORDER — LEVOFLOXACIN 250 MG PO TABS: 750.0000 mg | ORAL_TABLET | Freq: Every day | ORAL | 0 refills | Status: DC

## 2020-12-17 NOTE — ED Provider Notes (Signed)
[facility]    CSN: [id number] Arrival date & time: 12/17/20  1836      History   Chief Complaint Chief Complaint  Patient presents with  . Dental Pain    HPI [name] is a 41 y.o. female.   HPI  Dental Pain: Patient reports that about 1 to 2 weeks ago she started having some left upper dental pain.  She states that she was having pain and tenderness to biting down on foods when eating but was not having any tenderness to hot or cold.  She reports that she called her dentist to see if they could fit her in to be seen however their first available appointment was at the end of June.  She states that over the past day she has noticed some slight swelling on the side of her face and has noticed a little bit of a headache on the side as well as some radiating ear pain.  She is afraid that she might have a dental infection.  No trouble breathing or swallowing and no known fevers but she has had some mild chills.  Past Medical History:  Diagnosis Date  . Anxiety   . Asthma   . Family history of adverse reaction to anesthesia    oldest son hyper with anesthesia at age 51  . GERD (gastroesophageal reflux disease)   . Headache    migraine  . Panic attack   . Pelvic pain   . PTSD (post-traumatic stress disorder)     Patient Active Problem List   Diagnosis Date Noted  . Pelvic pain 06/28/2019  . 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 OBESITY, NOS 09/22/2006  . DEPRESSION, MAJOR, RECURRENT 09/22/2006  . RHINITIS, ALLERGIC 09/22/2006  . 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 IRRITABLE BOWEL SYNDROME 09/22/2006    Past Surgical History:  Procedure Laterality Date  . ADENOIDECTOMY    . CESAREAN SECTION    . lap removal of adhesions  2008   removal of endometriosis  . LAPAROSCOPY N/A 06/28/2019   Procedure: LAPAROSCOPY DIAGNOSTIC LYSIS OF ADHESIONS;  Surgeon: Richardean Chimera, MD;  Location: [facility] [place];  Service: Gynecology;  Laterality: N/A;  . lymph node  disection     results benign  . MYRINGOTOMY    . reduction of turbinates    . TONSILLECTOMY      OB History    Gravida  5   Para  4   Term  4   Preterm      AB  1   Living        SAB  1   IAB      Ectopic      Multiple      Live Births  4            Home Medications    Prior to Admission medications   Medication Sig Start Date End Date Taking? Authorizing Provider  acetaminophen (TYLENOL) 500 MG tablet Take 1,000 mg by mouth every 6 (six) hours as needed for mild pain or headache.    [provider]  albuterol (PROVENTIL HFA;VENTOLIN HFA) 108 (90 BASE) MCG/ACT inhaler Inhale 1 puff into the lungs every 6 (six) hours as needed for wheezing or shortness of breath.    [provider]  cetirizine (ZYRTEC) 10 MG tablet Take 10 mg by mouth daily.    [provider]  dextromethorphan-guaiFENesin (MUCINEX DM) 30-600 MG 12hr tablet Take 1 tablet by mouth 2 (two) times daily. 05/28/20  Wieters, Hallie C, PA-C  fluticasone (FLONASE) 50 MCG/ACT nasal spray Place 2 sprays into both nostrils daily.    [provider]  levonorgestrel (MIRENA) 20 MCG/24HR IUD 1 each by Intrauterine route once.    [provider]  montelukast (SINGULAIR) 10 MG tablet Take 10 mg by mouth at bedtime.    [provider]  naproxen (NAPROSYN) 500 MG tablet Take 1 tablet (500 mg total) by mouth 2 (two) times daily. 11/19/20   Vanetta Mulders, MD    Family History Family History  Problem Relation Age of Onset  . Hepatitis C Mother   . Hypertension Mother   . Diabetes Mother   . Cancer Mother   . Hepatitis C Father   . Hypertension Father   . Diabetes Other   . Hypertension Other   . Arthritis Other   . Breast cancer Maternal Grandmother     Social History Social History   Tobacco Use  . Smoking status: Former Smoker    Types: Cigarettes  . Smokeless tobacco: Never Used  . Tobacco comment: quit 04-22-1999 social   Vaping Use  .  Vaping Use: Never used  Substance Use Topics  . Alcohol use: Yes    Comment: occ  . Drug use: No     Allergies   Clindamycin, Clindamycin/lincomycin, Penicillins, Sulfa antibiotics, Clotrimazole, Monistat [miconazole nitrate-wipes], Neosporin [neomycin-bacitracin zn-polymyx], Oxybutynin, Betadine [povidone iodine], and Povidone-iodine   Review of Systems Review of Systems  As stated above in HPI Physical Exam Triage Vital Signs ED Triage Vitals [12/17/20 1906]  Enc Vitals Group     BP 130/75     Pulse Rate 65     Resp 18     Temp 99 F (37.2 C)     Temp src      SpO2 100 %     Weight      Height      Head Circumference      Peak Flow      Pain Score      Pain Loc      Pain Edu?      Excl. in GC?    No data found.  Updated Vital Signs BP 130/75   Pulse 65   Temp 99 F (37.2 C)   Resp 18   LMP 12/04/2020   SpO2 100%   Physical Exam Vitals and nursing note reviewed.  Constitutional:      General: She is not in acute distress.    Appearance: Normal appearance. She is not ill-appearing, toxic-appearing or diaphoretic.  HENT:     Head: Normocephalic and atraumatic.     Jaw: There is normal jaw occlusion. Swelling (scant) present. No tenderness or pain on movement.     Right Ear: Tympanic membrane, ear canal and external ear normal.     Left Ear: Tympanic membrane, ear canal and external ear normal.     Nose: Nose normal.     Mouth/Throat:     Lips: Pink.     Mouth: Mucous membranes are moist.     Dentition: Dental tenderness present.     Pharynx: Oropharynx is clear.      Comments: No edema, tenderness or bogginess of inferior or superior palate  Eyes:     Extraocular Movements: Extraocular movements intact.     Pupils: Pupils are equal, round, and reactive to light.  Cardiovascular:     Rate and Rhythm: Normal rate and regular rhythm.     Heart sounds: Normal heart sounds.  Pulmonary:     Effort: Pulmonary effort is normal.     Breath sounds: Normal  breath sounds.  Musculoskeletal:     Cervical back: Normal range of motion and neck supple.  Lymphadenopathy:     Cervical: No cervical adenopathy.  Neurological:     Mental Status: She is alert.      UC Treatments / Results  Labs (all labs ordered are listed, but only abnormal results are displayed) Labs Reviewed - No data to display  EKG   Radiology No results found.  Procedures Procedures (including critical care time)  Medications Ordered in UC Medications - No data to display  Initial Impression / Assessment and Plan / UC Course  I have reviewed the triage vital signs and the nursing notes.  Pertinent labs & imaging results that were available during my care of the patient were reviewed by me and considered in my medical decision making (see chart for details).     New.  Given her pain which is reproducible along with her low-grade fever we are going to go ahead and treat her for dental infection to prevent Ludwigs Angina.  Given her severe allergies to first and second line treatment we are going to treat with third line which is levofloxacin 750 mg p.o. daily along with Flagyl 500 mg Q8.  Discussed how to use along with common potential side effects of precautions.  Discussed red flag signs and symptoms.  Encourage follow-up with her dentist. Final Clinical Impressions(s) / UC Diagnoses   Final diagnoses:  None   Discharge Instructions   None    ED Prescriptions    None     PDMP not reviewed this encounter.   Rushie Chestnut, [place] 12/17/20 1947

## 2020-12-17 NOTE — ED Triage Notes (Signed)
Pt reports increased dental pain with facial swelling on Lt side of face.

## 2020-12-25 ENCOUNTER — Ambulatory Visit (INDEPENDENT_AMBULATORY_CARE_PROVIDER_SITE_OTHER): Payer: BC Managed Care – PPO | Admitting: Primary Care

## 2020-12-25 ENCOUNTER — Other Ambulatory Visit (INDEPENDENT_AMBULATORY_CARE_PROVIDER_SITE_OTHER): Payer: Self-pay | Admitting: Primary Care

## 2020-12-25 ENCOUNTER — Encounter (INDEPENDENT_AMBULATORY_CARE_PROVIDER_SITE_OTHER): Payer: Self-pay | Admitting: Primary Care

## 2020-12-25 ENCOUNTER — Other Ambulatory Visit: Payer: Self-pay

## 2020-12-25 VITALS — BP 130/81 | HR 70 | Temp 97.5°F | Ht 64.0 in | Wt 208.4 lb

## 2020-12-25 DIAGNOSIS — Z7689 Persons encountering health services in other specified circumstances: Secondary | ICD-10-CM

## 2020-12-25 DIAGNOSIS — E6609 Other obesity due to excess calories: Secondary | ICD-10-CM | POA: Diagnosis not present

## 2020-12-25 DIAGNOSIS — E876 Hypokalemia: Secondary | ICD-10-CM | POA: Diagnosis not present

## 2020-12-25 DIAGNOSIS — K0889 Other specified disorders of teeth and supporting structures: Secondary | ICD-10-CM

## 2020-12-25 DIAGNOSIS — Z720 Tobacco use: Secondary | ICD-10-CM

## 2020-12-25 DIAGNOSIS — F17210 Nicotine dependence, cigarettes, uncomplicated: Secondary | ICD-10-CM

## 2020-12-25 DIAGNOSIS — Z09 Encounter for follow-up examination after completed treatment for conditions other than malignant neoplasm: Secondary | ICD-10-CM

## 2020-12-25 DIAGNOSIS — Z6835 Body mass index (BMI) 35.0-35.9, adult: Secondary | ICD-10-CM

## 2020-12-25 MED ORDER — POTASSIUM CHLORIDE CRYS ER 20 MEQ PO TBCR: 20.0000 meq | EXTENDED_RELEASE_TABLET | Freq: Every day | ORAL | 0 refills | Status: DC

## 2020-12-25 MED ORDER — NAPROXEN SODIUM 550 MG PO TABS: 550.0000 mg | ORAL_TABLET | Freq: Three times a day (TID) | ORAL | 0 refills | Status: DC

## 2020-12-25 NOTE — Patient Instructions (Signed)
[phone] Dentist Dr.Adornetto

## 2020-12-25 NOTE — Telephone Encounter (Signed)
Sent to PCP ?

## 2020-12-25 NOTE — Progress Notes (Signed)
[facility]   Subjective:   [name] is a 40 y.o. female presents for hospital follow up and establish care. Presented to the emergency room on  12/17/20, for dental Pain: Patient reports that about 1 to 2 weeks ago she started having some left upper dental pain.  She states that she was having pain and tenderness to biting down on foods when eating but was not having any tenderness to hot or cold.  She called her dentist and no available appointment until the end of June.  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  This is repetitious motion that can be underlining cause of muscle strain or pull.  Past Medical History:  Diagnosis Date  . Anxiety   . Asthma   . Family history of adverse reaction to anesthesia    oldest son hyper with anesthesia at age 38  . GERD (gastroesophageal reflux disease)   . Headache    migraine  . Panic attack   . Pelvic pain   . PTSD (post-traumatic stress disorder)      Allergies  Allergen Reactions  . Clindamycin Rash and Shortness Of Breath    Other reaction(s): Other (See Comments) Elevated BP  . Clindamycin/Lincomycin Shortness Of Breath, Rash and Other (See Comments)    Elevated BP  . Penicillins Anaphylaxis and Hives  . Sulfa Antibiotics Rash  . Clotrimazole     "cracks my skin open"  . Monistat [Miconazole Nitrate-Wipes] Other (See Comments)    'cracks my skin open'  . Neosporin [Neomycin-Bacitracin Zn-Polymyx] Other (See Comments)    'cracks my skin open'  . Oxybutynin     Other reaction(s): Cramps (ALLERGY/intolerance) Pt had severe abdominal cramps  . Betadine [Povidone Iodine] Hives and Rash  . Povidone-Iodine Rash and Hives      Current Outpatient Medications on File Prior to Visit  Medication Sig Dispense Refill  . acetaminophen (TYLENOL) 500 MG tablet Take  1,000 mg by mouth every 6 (six) hours as needed for mild pain or headache.    . naproxen (NAPROSYN) 500 MG tablet Take 1 tablet (500 mg total) by mouth 2 (two) times daily. 30 tablet 0  . albuterol (PROVENTIL HFA;VENTOLIN HFA) 108 (90 BASE) MCG/ACT inhaler Inhale 1 puff into the lungs every 6 (six) hours as needed for wheezing or shortness of breath.    . cetirizine (ZYRTEC) 10 MG tablet Take 10 mg by mouth daily.    . fluticasone (FLONASE) 50 MCG/ACT nasal spray Place 2 sprays into both nostrils daily.    Marland Kitchen levonorgestrel (MIRENA) 20 MCG/24HR IUD 1 each by Intrauterine route once.    . montelukast (SINGULAIR) 10 MG tablet Take 10 mg by mouth at bedtime.     No current facility-administered medications on file prior to visit.     Review of System: Review of Systems  HENT:       Dental pain sensitivity to heat but tolerates cold unable to chew. Question if wisdom tooth or filling fell out.  Musculoskeletal:       Right shoulder pain   Neurological:       Dental pain   Psychiatric/Behavioral: The patient has insomnia.        Secondary to tooth  All other systems reviewed and are negative.   Objective:  BP 130/81 (BP Location: Right Arm, Patient Position: Sitting, Cuff Size:  Large)   Pulse 70   Temp (!) 97.5 F (36.4 C) (Temporal)   Ht 5\' 4"  (1.626 m)   Wt 208 lb 6.4 oz (94.5 kg)   LMP 12/04/2020   SpO2 100%   BMI 35.77 kg/m   Filed Weights   12/25/20 1337  Weight: 208 lb 6.4 oz (94.5 kg)    Physical Exam: General Appearance: Well nourished, obese female in no apparent distress. Eyes: PERRLA, EOMs, conjunctiva no swelling or erythema Sinuses: No Frontal/maxillary tenderness ENT/Mouth: Ext aud canals clear, TMs without erythema, bulging. Hearing normal.  Neck: Supple, thyroid normal.  Respiratory: Respiratory effort normal, BS equal bilaterally without rales, rhonchi, wheezing or stridor.  Cardio: RRR with no MRGs. Brisk peripheral pulses without edema.  Abdomen: Soft, +  BS.  Non tender, no guarding, rebound, hernias, masses. Lymphatics: Non tender without lymphadenopathy.  Musculoskeletal: Full ROM, 5/5 strength, normal gait.  Skin: Warm, dry without rashes, lesions, ecchymosis.  Neuro: Normal muscle tone Psych: Awake and oriented X 3, normal affect, Insight and Judgment appropriate.    Assessment:  [name] was seen today for new patient (initial visit).  Diagnoses and all orders for this visit:  Hospital discharge follow-up Seen in the emergency room for dental pain treated with antibiotics and pain is still present no earlier appointment with dentist.  She will try to reach out to them again and be placed on the waiting list.  Encounter to establish care Establish care with new provider  Pain, dental  1 or 2 or both impacted wisdom to or a feeling that has cracked or came off causing headaches throbbing pain and interfering with sleep.  She has been taking Tylenol and know she has exceeded recommend but naproxen is every 12 hours  Class 2 obesity due to excess calories without serious comorbidity with body mass index (BMI) of 35.0 to 35.9 in adult Obesity is 30-39 indicating an excess in caloric intake or underlining conditions. This may lead to other co-morbidities. Lifestyle modifications of diet and exercise may reduce obesity.  Risk include cardiovascular disease, hyperlipidemia, diabetes, and respiratory complications.  Tobacco abuse   Increased risk for lung cancer and other respiratory diseases recommend cessation. No ready to quit at this time increase stressors in her life- taking care of her mom and kids. This will be reminded at each clinical visit. [name] was seen today for new patient (initial visit).  Hypokalemia Reviewing labs indicated low potassium supplement ordered  Other orders -     potassium chloride SA (KLOR-CON) 20 MEQ tablet; Take 1 tablet (20 mEq total) by mouth daily. -     Discontinue: naproxen sodium (ANAPROX) 550  MG tablet; Take 1 tablet (550 mg total) by mouth 3 (three) times daily with meals.     This note has been created with Cala Bradford. Any transcriptional errors are unintentional.   Education officer, environmental, NP 12/25/2020, 1:41 PM

## 2021-01-02 ENCOUNTER — Telehealth (INDEPENDENT_AMBULATORY_CARE_PROVIDER_SITE_OTHER): Payer: Self-pay

## 2021-01-02 NOTE — Telephone Encounter (Signed)
Copied from CRM [id number]. Topic: General - Other >> Jan 02, 2021  9:50 AM Wyonia Hough E wrote: Reason for CRM: Pt called to let Marcelino Duster know she tested positive for covid today / nothing needed at this time.  This is just an [place]

## 2021-01-08 DIAGNOSIS — Z03818 Encounter for observation for suspected exposure to other biological agents ruled out: Secondary | ICD-10-CM | POA: Diagnosis not present

## 2021-01-08 DIAGNOSIS — Z20822 Contact with and (suspected) exposure to covid-19: Secondary | ICD-10-CM | POA: Diagnosis not present

## 2021-01-08 DIAGNOSIS — Z111 Encounter for screening for respiratory tuberculosis: Secondary | ICD-10-CM | POA: Diagnosis not present

## 2021-01-10 DIAGNOSIS — Z111 Encounter for screening for respiratory tuberculosis: Secondary | ICD-10-CM | POA: Diagnosis not present

## 2021-01-30 ENCOUNTER — Other Ambulatory Visit (INDEPENDENT_AMBULATORY_CARE_PROVIDER_SITE_OTHER): Payer: Self-pay | Admitting: Primary Care

## 2021-03-03 ENCOUNTER — Other Ambulatory Visit (INDEPENDENT_AMBULATORY_CARE_PROVIDER_SITE_OTHER): Payer: Self-pay | Admitting: Primary Care

## 2021-03-13 DIAGNOSIS — Z3009 Encounter for other general counseling and advice on contraception: Secondary | ICD-10-CM | POA: Diagnosis not present

## 2021-03-13 DIAGNOSIS — Z01419 Encounter for gynecological examination (general) (routine) without abnormal findings: Secondary | ICD-10-CM | POA: Diagnosis not present

## 2021-03-13 DIAGNOSIS — Z113 Encounter for screening for infections with a predominantly sexual mode of transmission: Secondary | ICD-10-CM | POA: Diagnosis not present

## 2021-03-13 DIAGNOSIS — Z30432 Encounter for removal of intrauterine contraceptive device: Secondary | ICD-10-CM | POA: Diagnosis not present

## 2021-03-13 DIAGNOSIS — N760 Acute vaginitis: Secondary | ICD-10-CM | POA: Diagnosis not present

## 2021-03-31 ENCOUNTER — Other Ambulatory Visit (INDEPENDENT_AMBULATORY_CARE_PROVIDER_SITE_OTHER): Payer: Self-pay | Admitting: Primary Care

## 2021-03-31 NOTE — Telephone Encounter (Signed)
Sent to PCP ?

## 2021-04-05 ENCOUNTER — Encounter (INDEPENDENT_AMBULATORY_CARE_PROVIDER_SITE_OTHER): Payer: Self-pay | Admitting: Primary Care

## 2021-04-05 ENCOUNTER — Other Ambulatory Visit (INDEPENDENT_AMBULATORY_CARE_PROVIDER_SITE_OTHER): Payer: Self-pay | Admitting: Primary Care

## 2021-04-05 DIAGNOSIS — E876 Hypokalemia: Secondary | ICD-10-CM

## 2021-05-12 ENCOUNTER — Other Ambulatory Visit: Payer: Self-pay | Admitting: Primary Care

## 2021-05-12 DIAGNOSIS — Z1231 Encounter for screening mammogram for malignant neoplasm of breast: Secondary | ICD-10-CM

## 2021-05-15 ENCOUNTER — Ambulatory Visit: Payer: BC Managed Care – PPO

## 2021-06-26 ENCOUNTER — Other Ambulatory Visit: Payer: Self-pay

## 2021-06-26 ENCOUNTER — Ambulatory Visit (INDEPENDENT_AMBULATORY_CARE_PROVIDER_SITE_OTHER): Payer: BC Managed Care – PPO | Admitting: Primary Care

## 2021-06-26 ENCOUNTER — Encounter (INDEPENDENT_AMBULATORY_CARE_PROVIDER_SITE_OTHER): Payer: Self-pay | Admitting: Primary Care

## 2021-06-26 VITALS — BP 128/85 | HR 84 | Temp 97.3°F | Ht 64.0 in | Wt 206.8 lb

## 2021-06-26 DIAGNOSIS — Z0000 Encounter for general adult medical examination without abnormal findings: Secondary | ICD-10-CM

## 2021-06-26 DIAGNOSIS — Z13220 Encounter for screening for lipoid disorders: Secondary | ICD-10-CM | POA: Diagnosis not present

## 2021-06-26 DIAGNOSIS — Z6835 Body mass index (BMI) 35.0-35.9, adult: Secondary | ICD-10-CM | POA: Diagnosis not present

## 2021-06-26 DIAGNOSIS — Z0001 Encounter for general adult medical examination with abnormal findings: Secondary | ICD-10-CM

## 2021-06-26 DIAGNOSIS — E66812 Obesity, class 2: Secondary | ICD-10-CM

## 2021-06-26 DIAGNOSIS — Z23 Encounter for immunization: Secondary | ICD-10-CM

## 2021-06-26 DIAGNOSIS — E876 Hypokalemia: Secondary | ICD-10-CM | POA: Diagnosis not present

## 2021-06-26 NOTE — Patient Instructions (Addendum)
Calorie Counting for Weight Loss Calories are units of energy. Your body needs a certain number of calories from food to keep going throughout the day. When you eat or drink more calories than your body needs, your body stores the extra calories mostly as fat. When you eat or drink fewer calories than your body needs, your body burns fat to get the energy it needs. Calorie counting means keeping track of how many calories you eat and drink each day. Calorie counting can be helpful if you need to lose weight. If you eat fewer calories than your body needs, you should lose weight. Ask your health care provider what a healthy weight is for you. For calorie counting to work, you will need to eat the right number of calories each day to lose a healthy amount of weight per week. A dietitian can help you figure out how many calories you need in a day and will suggest ways to reach your calorie goal. A healthy amount of weight to lose each week is usually 1-2 lb (0.5-0.9 kg). This usually means that your daily calorie intake should be reduced by 500-750 calories. Eating 1,200-1,500 calories a day can help most women lose weight. Eating 1,500-1,800 calories a day can help most men lose weight. What do I need to know about calorie counting? Work with your health care provider or dietitian to determine how many calories you should get each day. To meet your daily calorie goal, you will need to: Find out how many calories are in each food that you would like to eat. Try to do this before you eat. Decide how much of the food you plan to eat. Keep a food log. Do this by writing down what you ate and how many calories it had. To successfully lose weight, it is important to balance calorie counting with a healthy lifestyle that includes regular activity. Where do I find calorie information? The number of calories in a food can be found on a Nutrition Facts label. If a food does not have a Nutrition Facts label, try to  look up the calories online or ask your dietitian for help. Remember that calories are listed per serving. If you choose to have more than one serving of a food, you will have to multiply the calories per serving by the number of servings you plan to eat. For example, the label on a package of bread might say that a serving size is 1 slice and that there are 90 calories in a serving. If you eat 1 slice, you will have eaten 90 calories. If you eat 2 slices, you will have eaten 180 calories. How do I keep a food log? After each time that you eat, record the following in your food log as soon as possible: What you ate. Be sure to include toppings, sauces, and other extras on the food. How much you ate. This can be measured in cups, ounces, or number of items. How many calories were in each food and drink. The total number of calories in the food you ate. Keep your food log near you, such as in a pocket-sized notebook or on an app or website on your mobile phone. Some programs will calculate calories for you and show you how many calories you have left to meet your daily goal. What are some portion-control tips? Know how many calories are in a serving. This will help you know how many servings you can have of a certain food.  Use a measuring cup to measure serving sizes. You could also try weighing out portions on a kitchen scale. With time, you will be able to estimate serving sizes for some foods. Take time to put servings of different foods on your favorite plates or in your favorite bowls and cups so you know what a serving looks like. Try not to eat straight from a food's packaging, such as from a bag or box. Eating straight from the package makes it hard to see how much you are eating and can lead to overeating. Put the amount you would like to eat in a cup or on a plate to make sure you are eating the right portion. Use smaller plates, glasses, and bowls for smaller portions and to prevent  overeating. Try not to multitask. For example, avoid watching TV or using your computer while eating. If it is time to eat, sit down at a table and enjoy your food. This will help you recognize when you are full. It will also help you be more mindful of what and how much you are eating. What are tips for following this plan? Reading food labels Check the calorie count compared with the serving size. The serving size may be smaller than what you are used to eating. Check the source of the calories. Try to choose foods that are high in protein, fiber, and vitamins, and low in saturated fat, trans fat, and sodium. Shopping Read nutrition labels while you shop. This will help you make healthy decisions about which foods to buy. Pay attention to nutrition labels for low-fat or fat-free foods. These foods sometimes have the same number of calories or more calories than the full-fat versions. They also often have added sugar, starch, or salt to make up for flavor that was removed with the fat. Make a grocery list of lower-calorie foods and stick to it. Cooking Try to cook your favorite foods in a healthier way. For example, try baking instead of frying. Use low-fat dairy products. Meal planning Use more fruits and vegetables. One-half of your plate should be fruits and vegetables. Include lean proteins, such as chicken, [place], and fish. Lifestyle Each week, aim to do one of the following: 150 minutes of moderate exercise, such as walking. 75 minutes of vigorous exercise, such as running. General information Know how many calories are in the foods you eat most often. This will help you calculate calorie counts faster. Find a way of tracking calories that works for you. Get creative. Try different apps or programs if writing down calories does not work for you. What foods should I eat?  Eat nutritious foods. It is better to have a nutritious, high-calorie food, such as an avocado, than a food with  few nutrients, such as a bag of potato chips. Use your calories on foods and drinks that will fill you up and will not leave you hungry soon after eating. Examples of foods that fill you up are nuts and nut butters, vegetables, lean proteins, and high-fiber foods such as whole grains. High-fiber foods are foods with more than 5 g of fiber per serving. Pay attention to calories in drinks. Low-calorie drinks include water and unsweetened drinks. The items listed above may not be a complete list of foods and beverages you can eat. Contact a dietitian for more information. What foods should I limit? Limit foods or drinks that are not good sources of vitamins, minerals, or protein or that are high in unhealthy fats. These include:  Candy. Other sweets. Sodas, specialty coffee drinks, alcohol, and juice. The items listed above may not be a complete list of foods and beverages you should avoid. Contact a dietitian for more information. How do I count calories when eating out? Pay attention to portions. Often, portions are much larger when eating out. Try these tips to keep portions smaller: Consider sharing a meal instead of getting your own. If you get your own meal, eat only half of it. Before you start eating, ask for a container and put half of your meal into it. When available, consider ordering smaller portions from the menu instead of full portions. Pay attention to your food and drink choices. Knowing the way food is cooked and what is included with the meal can help you eat fewer calories. If calories are listed on the menu, choose the lower-calorie options. Choose dishes that include vegetables, fruits, whole grains, low-fat dairy products, and lean proteins. Choose items that are boiled, broiled, grilled, or steamed. Avoid items that are buttered, battered, fried, or served with cream sauce. Items labeled as crispy are usually fried, unless stated otherwise. Choose water, low-fat milk,  unsweetened iced tea, or other drinks without added sugar. If you want an alcoholic beverage, choose a lower-calorie option, such as a glass of wine or light beer. Ask for dressings, sauces, and syrups on the side. These are usually high in calories, so you should limit the amount you eat. If you want a salad, choose a garden salad and ask for grilled meats. Avoid extra toppings such as bacon, cheese, or fried items. Ask for the dressing on the side, or ask for olive oil and vinegar or lemon to use as dressing. Estimate how many servings of a food you are given. Knowing serving sizes will help you be aware of how much food you are eating at restaurants. Where to find more information Centers for Disease Control and Prevention: FootballExhibition.com.br U.S. Department of Agriculture: WrestlingReporter.dk Summary Calorie counting means keeping track of how many calories you eat and drink each day. If you eat fewer calories than your body needs, you should lose weight. A healthy amount of weight to lose per week is usually 1-2 lb (0.5-0.9 kg). This usually means reducing your daily calorie intake by 500-750 calories. The number of calories in a food can be found on a Nutrition Facts label. If a food does not have a Nutrition Facts label, try to look up the calories online or ask your dietitian for help. Use smaller plates, glasses, and bowls for smaller portions and to prevent overeating. Use your calories on foods and drinks that will fill you up and not leave you hungry shortly after a meal. This information is not intended to replace advice given to you by your health care provider. Make sure you discuss any questions you have with your health care provider. Document Revised: 08/23/2019 Document Reviewed: 08/23/2019 Elsevier Patient Education  2022 Elsevier Inc. Potassium Content of Foods  The body needs potassium to control blood pressure and to keep the muscles and nervous system healthy. Here are some healthy foods  below that are high in potassium. Also you can get the white label salt of "NO SALT" salt substitute, 1/4 teaspoon of this is equivalent to potassium.   FOODS AND DRINKS HIGH IN POTASSIUM FOODS MODERATE IN POTASSIUM   Fruits Avocado (cubed),  c / 50 g. Banana (sliced), 75 g. Cantaloupe (cubed), 80 g. Honeydew, 1 wedge / 85 g. Kiwi (sliced), 90  g. Nectarine, 1 small / 129 g. Orange, 1 medium / 131 g. Vegetables Artichoke,  of a medium / 64 g. Asparagus (boiled), 90 g.. Broccoli (boiled), 78 g. Brussels sprout (boiled), 78 g. Butternut squash (baked), 103 g. Chickpea (cooked), 82 g. Green peas (cooked), 80 g. Kidney beans (cooked), 5 tbsp / 55 g. Lima beans (cooked),  c / 43 g. Navy beans (cooked),  c / 61 g. Spinach (cooked),  c / 45 g. Sweet potato (baked),  c / 50 g. Tomato (chopped or sliced), 90 g. Vegetable juice. White mushrooms (cooked), 78 g. Yam (cooked or baked),  c / 34 g. Zucchini squash (boiled), 90 g. Other Foods and Drinks Almonds (whole),  c / 36 g. Fish, 3 oz / 85 g. Nonfat fruit variety yogurt, 123 g. Pistachio nuts, 1 oz / 28 g. Pumpkin seeds, 1 oz / 28 g. Red meat (broiled, cooked, grilled), 3 oz / 85 g. Scallops (steamed), 3 oz / 85 g. Spaghetti sauce,  c / 66 g. Sunflower seeds (dry roasted), 1 oz / 28 g. Veggie burger, 1 patty / 70 g. Fruits Grapefruit,  of the fruit / 123 g Plums (sliced), 83 g. Tangerine, 1 large / 120 g. Vegetables Carrots (boiled), 78 g. Carrots (sliced), 61 g. Rhubarb (cooked with sugar), 120 g. Rutabaga (cooked), 120 g. Yellow snap beans (cooked), 63 g. Other Foods and Drinks  Chicken breast (roasted and chopped),  c / 70 g. Pita bread, 1 large / 64 g. Shrimp (steamed), 4 oz / 113 g. Swiss cheese (diced), 70 g.     

## 2021-06-26 NOTE — Progress Notes (Signed)
[name] is a 40 y.o. female presents to office today for annual physical exam examination.    Concerns today include: 1.  Pain behind her left eye.  She has spoken with her ophthalmologist and was told her eyes are fine.  Note she does have migraines.  She does have auras photosensitivity then the pain behind her right eye. Review aed  Occupation: Psychologist, counselling., Marital status: Single, Substance use: No Diet: Monitors her sodium and carbohydrate, Exercise: Yes weight Last eye exam: Dec 20, 2020 Last dental exam June 20 10,022 Last colonoscopy: Not recommended to the age of 64 Last mammogram: Referral for mammogram Last pap smear: Schedule Pap Refills needed today: No Immunizations needed: Flu Vaccine: yes will receive today Tdap Vaccine: Up-to-date - every 89yr - (<3 lifetime doses or unknown): all wounds -- look up need for Tetanus IG - (>=3 lifetime doses): clean/minor wound if >142yrfrom previous; all other wounds if >5y31yrrom previous Zoster Vaccine: no (those >50yo, once) Pneumonia Vaccine: yes (those w/ risk factors) - (<65y15yrth: Immunocompromised, cochlear implant, CSF leak, asplenic, sickle cell, Chronic Renal Failure - (<92yr7yrV-23 only: Heart dz, lung disease, DM, tobacco abuse, alcoholism, cirrhosis/liver disease. - (>92yr)1yrV13 then PPSV23 in 6-12mths;  - (>92yr):71yrat PPSV23 once if pt received prior to 40yo an32yors ha23yrassed  Past Medical History:  Diagnosis Date   Anxiety    Asthma    Family history of adverse reaction to anesthesia    oldest son hyper with anesthesia at age 24   GERD75(gastroesophageal reflux disease)    Headache    migraine   Panic attack    Pelvic pain    PTSD (post-traumatic stress disorder)    Social History   Socioeconomic History   Marital status: Single    Spouse name: Not on file   Number of children: Not on file   Years of education: Not on file   Highest  education level: Not on file  Occupational History   Not on file  Tobacco Use   Smoking status: Former    Types: Cigarettes   Smokeless tobacco: Never   Tobacco comments:    quit 04-22-1999 social   Vaping Use   Vaping Use: Never used  Substance and Sexual Activity   Alcohol use: Yes    Comment: occ   Drug use: No   Sexual activity: Not on file  Other Topics Concern   Not on file  Social History Narrative   Not on file   Social Determinants of Health   Financial Resource Strain: Not on file  Food Insecurity: Not on file  Transportation Needs: Not on file  Physical Activity: Not on file  Stress: Not on file  Social Connections: Not on file  Intimate Partner Violence: Not on file   Past Surgical History:  Procedure Laterality Date   ADENOIDECTOMY     CESAREAN SECTION     lap removal of adhesions  2008   removal of endometriosis   LAPAROSCOPY N/A 06/28/2019   Procedure: LAPAROSCOPY DIAGNOSTIC LYSIS OF ADHESIONS;  Surgeon: McComb, Arvella Nighocation: [place]: Gynecology;  Laterality: N/A;   lymph node disection     results benign   MYRINGOTOMY     reduction of turbinates     TONSILLECTOMY     Family History  Problem Relation Age of Onset   Hepatitis C Mother    Hypertension Mother  Diabetes Mother    Cancer Mother    Hepatitis C Father    Hypertension Father    Diabetes Other    Hypertension Other    Arthritis Other    Breast cancer Maternal Grandmother     Current Outpatient Medications:    acetaminophen (TYLENOL) 500 MG tablet, Take 1,000 mg by mouth every 6 (six) hours as needed for mild pain or headache., Disp: , Rfl:    albuterol (PROVENTIL HFA;VENTOLIN HFA) 108 (90 BASE) MCG/ACT inhaler, Inhale 1 puff into the lungs every 6 (six) hours as needed for wheezing or shortness of breath., Disp: , Rfl:    cetirizine (ZYRTEC) 10 MG tablet, Take 10 mg by mouth daily., Disp: , Rfl:    fluticasone (FLONASE) 50 MCG/ACT nasal spray,  Place 2 sprays into both nostrils daily., Disp: , Rfl:    KLOR-CON M20 20 MEQ tablet, TAKE 1 TABLET BY MOUTH EVERY DAY (Patient not taking: Reported on 06/26/2021), Disp: 30 tablet, Rfl: 0   levonorgestrel (MIRENA) 20 MCG/24HR IUD, 1 each by Intrauterine route once., Disp: , Rfl:    montelukast (SINGULAIR) 10 MG tablet, Take 10 mg by mouth at bedtime., Disp: , Rfl:    naproxen (EC NAPROSYN) 500 MG EC tablet, Take 1 tablet (500 mg total) by mouth 2 (two) times daily with a meal., Disp: 120 tablet, Rfl: 0  Allergies  Allergen Reactions   Clindamycin Rash and Shortness Of Breath    Other reaction(s): Other (See Comments) Elevated BP   Clindamycin/Lincomycin Shortness Of Breath, Rash and Other (See Comments)    Elevated BP   Penicillins Anaphylaxis and Hives   Sulfa Antibiotics Rash   Clotrimazole     "cracks my skin open"   Monistat [Miconazole Nitrate-Wipes] Other (See Comments)    'cracks my skin open'   Neosporin [Neomycin-Bacitracin Zn-Polymyx] Other (See Comments)    'cracks my skin open'   Oxybutynin     Other reaction(s): Cramps (ALLERGY/intolerance) Pt had severe abdominal cramps   Betadine [Povidone Iodine] Hives and Rash   Povidone-Iodine Rash and Hives     ROS: Review of Systems Pertinent items noted in HPI and remainder of comprehensive ROS otherwise negative.    Physical exam BP 128/85 (BP Location: Right Arm, Patient Position: Sitting, Cuff Size: Large)   Pulse 84   Temp (!) 97.3 F (36.3 C) (Temporal)   Ht 5' 4"  (1.626 m)   Wt 206 lb 12.8 oz (93.8 kg)   LMP 06/17/2021 (Exact Date)   SpO2 99%   BMI 35.50 kg/m  General appearance: alert, cooperative, appears stated age, and no distress Head: Normocephalic, without obvious abnormality, atraumatic Eyes: conjunctivae/corneas clear. PERRL, EOM's intact. Fundi benign. Nose: Nares normal. Septum midline. Mucosa normal. No drainage or sinus tenderness. Neck: no adenopathy, no carotid bruit, no JVD, supple,  symmetrical, trachea midline, and thyroid not enlarged, symmetric, no tenderness/mass/nodules Lungs: clear to auscultation bilaterally Heart: regular rate and rhythm, S1, S2 normal, no murmur, click, rub or gallop Abdomen: soft, non-tender; bowel sounds normal; no masses,  no organomegaly Extremities: extremities normal, atraumatic, no cyanosis or edema Pulses: 2+ and symmetric    Assessment/ Plan: Carroll Sage Weathersby here for annual physical exam.   Diagnoses and all orders for this visit:  Need for prophylactic vaccination against Streptococcus pneumoniae (pneumococcus) -     Pneumococcal conjugate vaccine 20-valent (Prevnar 20)  Annual physical exam Completed   Hypokalemia The body needs potassium to control blood pressure and to keep the muscles and nervous  system healthy. Here are some healthy foods below that are high in potassium. Also you can get the white label salt of "NO SALT" salt substitute, 1/4 teaspoon of this is equivalent to 20mq potassium. Information on AVS -     CMP14+EGFR  Lipid screening  Healthy lifestyle diet of fruits vegetables fish nuts whole grains and low saturated fat . Foods high in cholesterol or liver, fatty meats,cheese, butter avocados, nuts and seeds, chocolate and fried foods. -     Lipid Panel  Class 2 severe obesity due to excess calories with serious comorbidity in adult, unspecified BMI ([place] -     Lipid Panel     Counseled on healthy lifestyle choices, including diet (rich in fruits, vegetables and lean meats and low in salt and simple carbohydrates) and exercise (at least 30 minutes of moderate physical activity daily).  Patient to follow up in 1 year for annual exam or sooner if needed.  The above assessment and management plan was discussed with the patient. The patient verbalized understanding of and has agreed to the management plan. Patient is aware to call the clinic if symptoms persist or worsen. Patient is aware when to return  to the clinic for a follow-up visit. Patient educated on when it is appropriate to go to the emergency department.   MJuluis MireNP-C [address]338 Sugar StreetGProspect2[phone]

## 2021-06-27 LAB — LIPID PANEL
Chol/HDL Ratio: 3 ratio (ref 0.0–4.4)
Cholesterol, Total: 176 mg/dL (ref 100–199)
HDL: 59 mg/dL (ref >39)
LDL Chol Calc (NIH): 109 mg/dL — ABNORMAL HIGH (ref 0–99)
Triglycerides: 41 mg/dL (ref 0–149)
VLDL Cholesterol Cal: 8 mg/dL (ref 5–40)

## 2021-06-27 LAB — CMP14+EGFR
ALT: 13 IU/L (ref 0–32)
AST: 19 IU/L (ref 0–40)
Albumin/Globulin Ratio: 1.9 (ref 1.2–2.2)
Albumin: 4.3 g/dL (ref 3.8–4.8)
Alkaline Phosphatase: 44 IU/L (ref 44–121)
BUN/Creatinine Ratio: 17 (ref 9–23)
BUN: 15 mg/dL (ref 6–24)
Bilirubin Total: 0.6 mg/dL (ref 0.0–1.2)
CO2: 24 mmol/L (ref 20–29)
Calcium: 9.3 mg/dL (ref 8.7–10.2)
Chloride: 103 mmol/L (ref 96–106)
Creatinine, Ser: 0.9 mg/dL (ref 0.57–1.00)
Globulin, Total: 2.3 g/dL (ref 1.5–4.5)
Glucose: 81 mg/dL (ref 70–99)
Potassium: 4.3 mmol/L (ref 3.5–5.2)
Sodium: 140 mmol/L (ref 134–144)
Total Protein: 6.6 g/dL (ref 6.0–8.5)
eGFR: 83 mL/min/{1.73_m2} (ref >59)

## 2021-07-15 ENCOUNTER — Ambulatory Visit (INDEPENDENT_AMBULATORY_CARE_PROVIDER_SITE_OTHER): Payer: BC Managed Care – PPO | Admitting: Primary Care

## 2021-08-02 IMAGING — CR DG CHEST 2V
2 series · 2 of 2 positions shown · non-contrast
Comparison: 07/07/2016.

CLINICAL DATA: chest pain.

EXAM:
CHEST - 2 VIEW

[w chest pa]
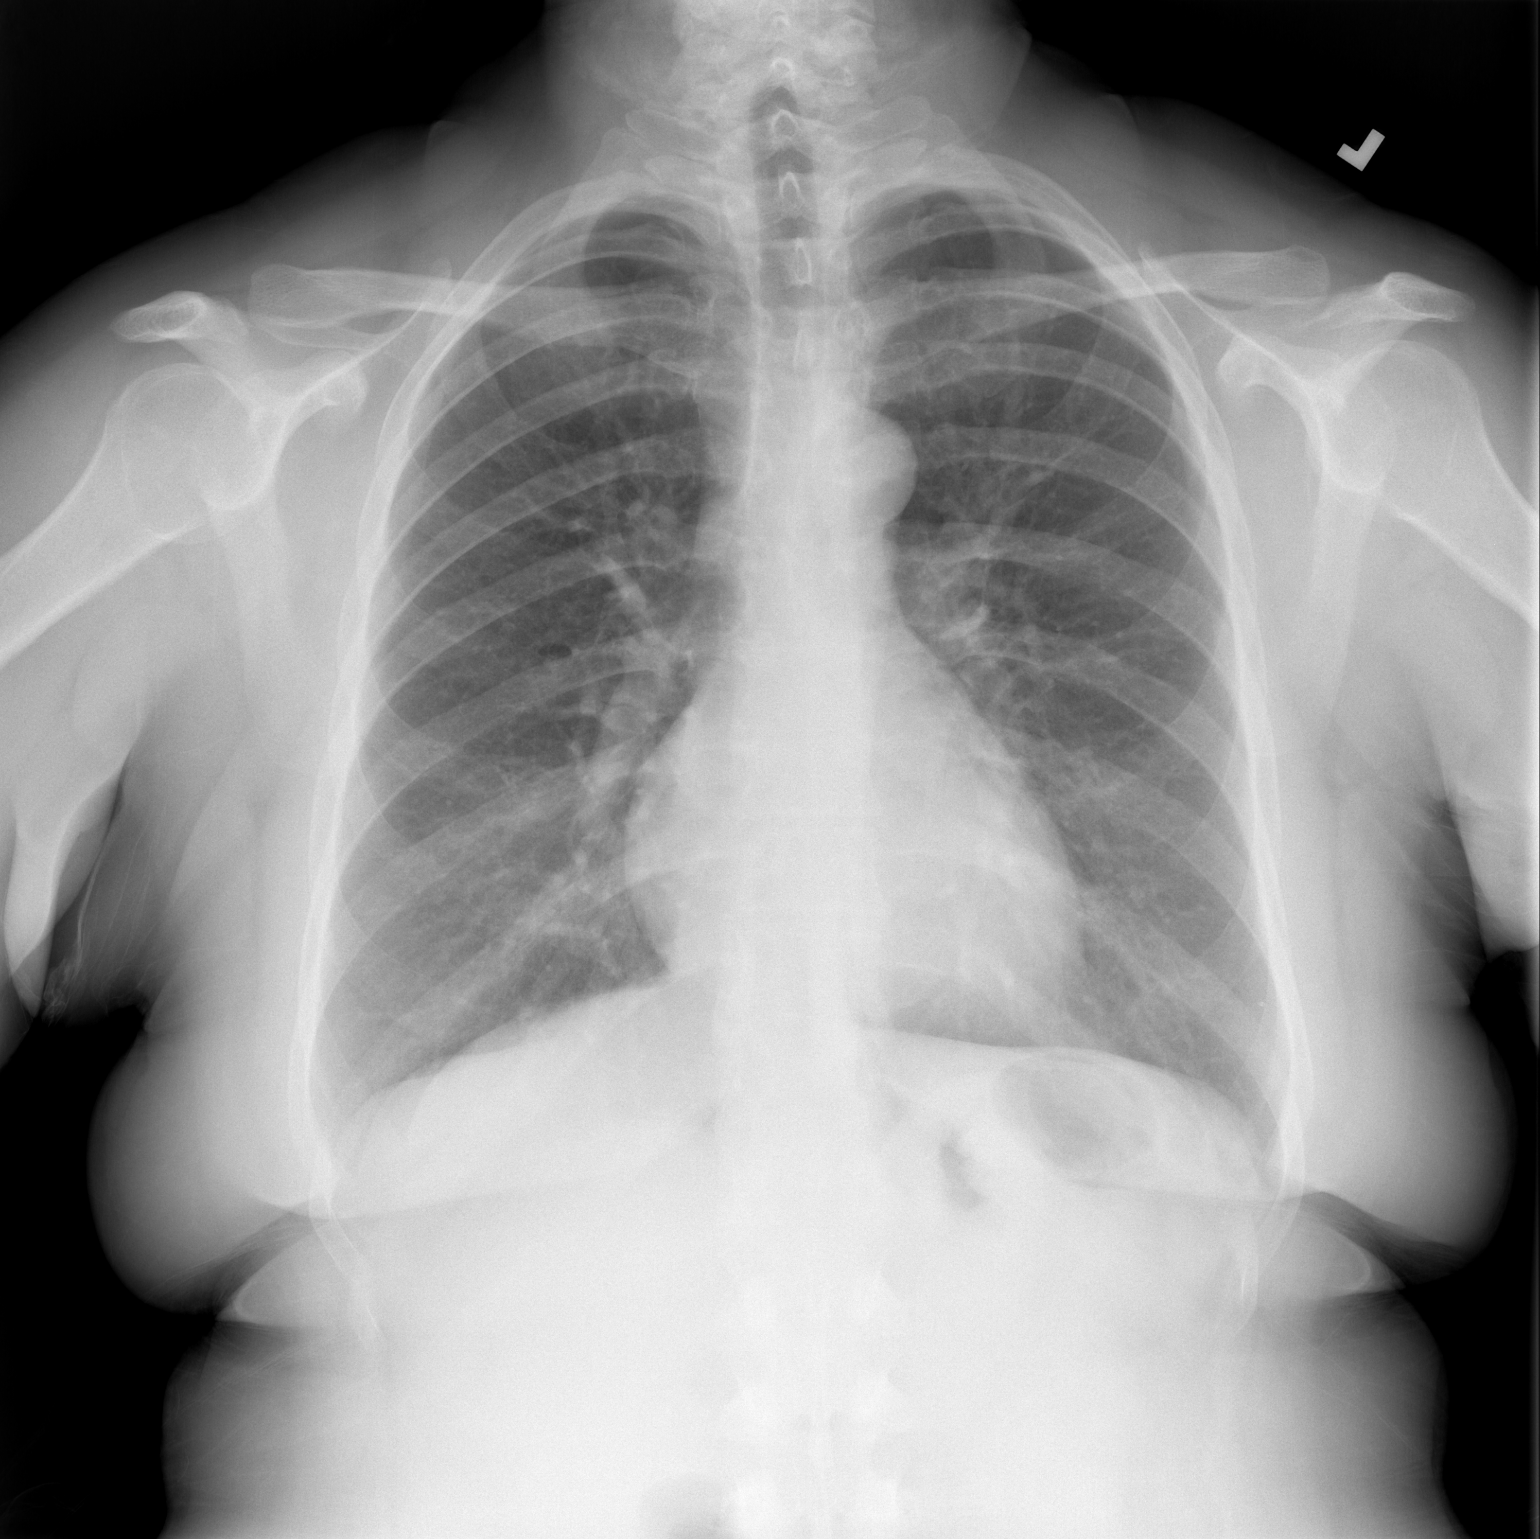

[w chest lat]
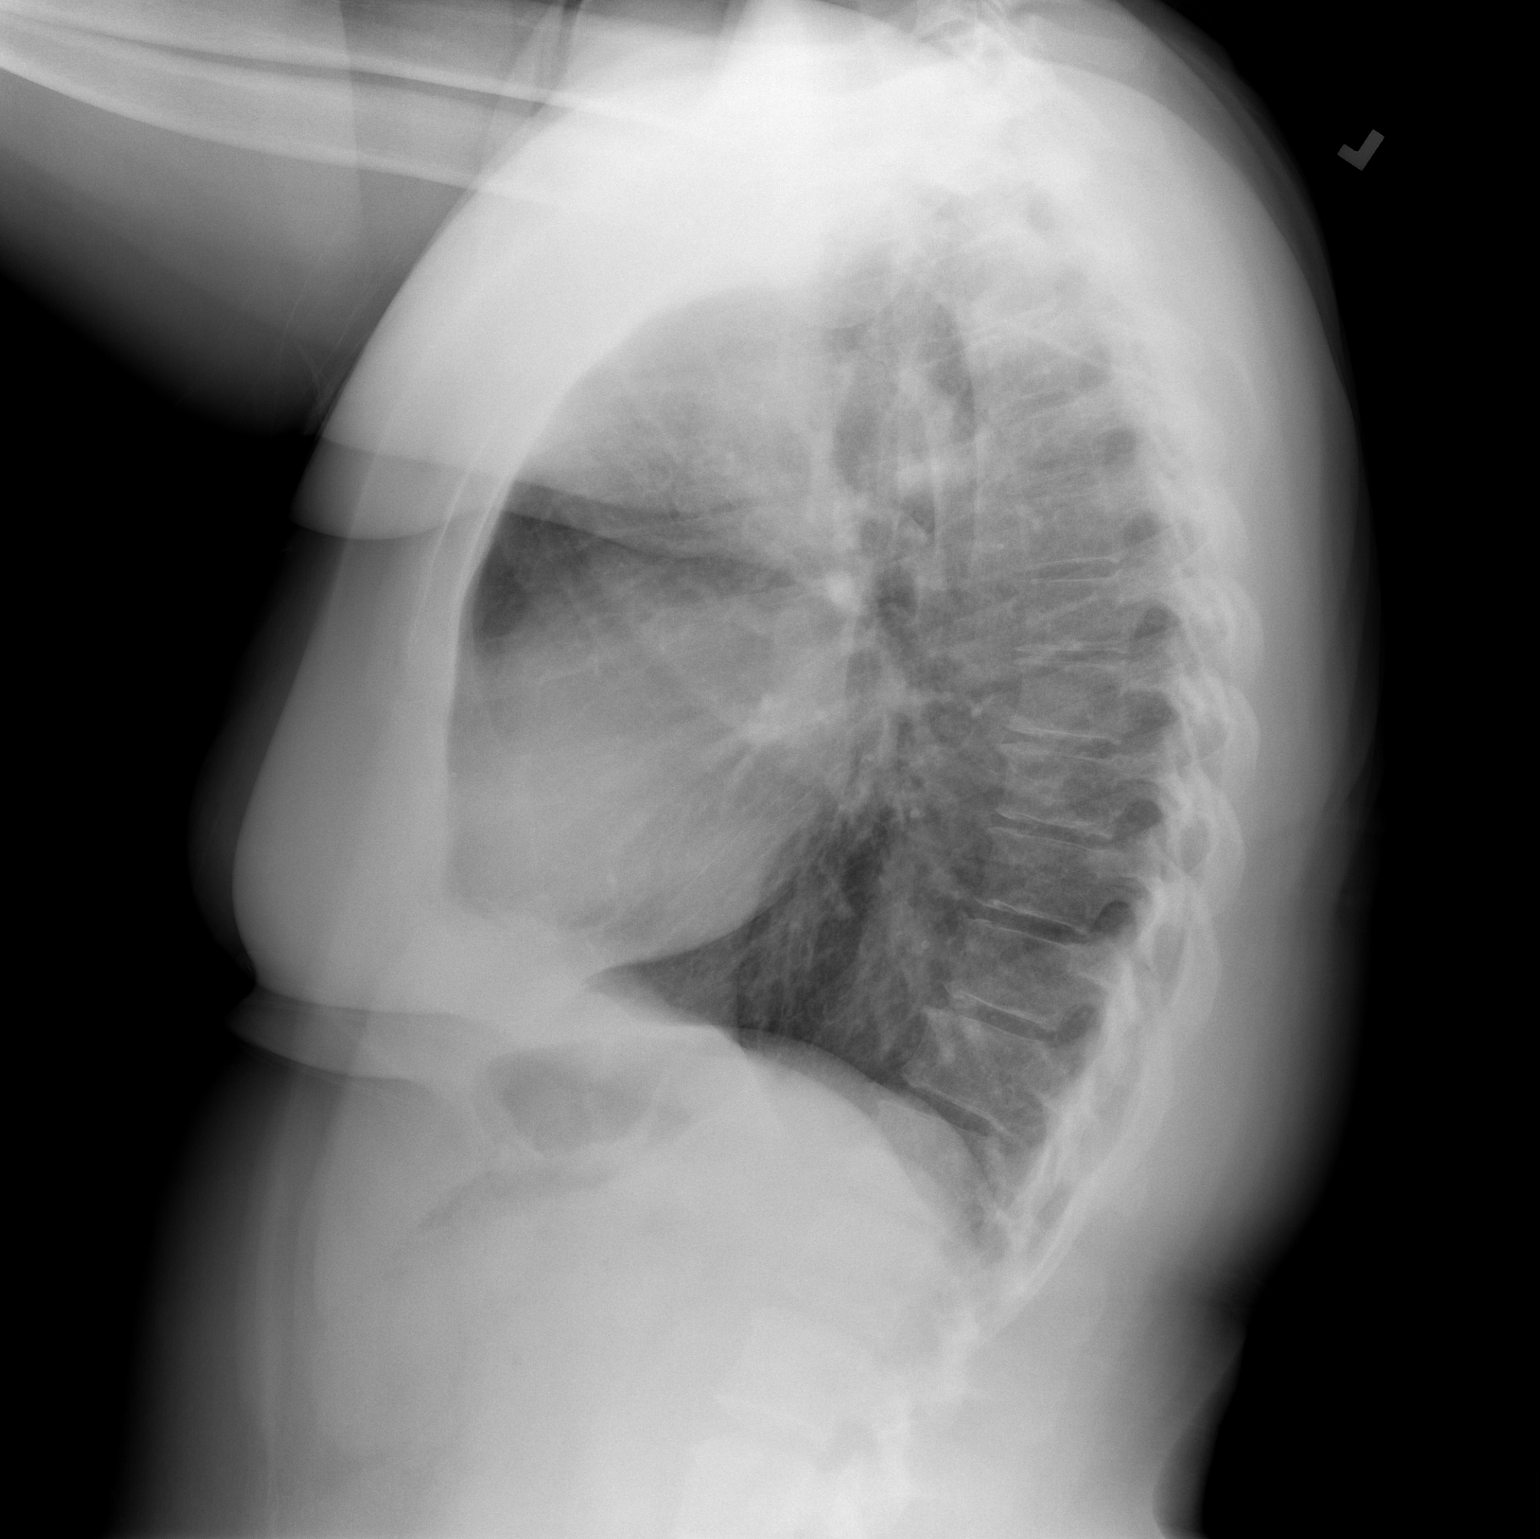

[2 of 2 positions shown; findings below may reference images not displayed]

FINDINGS: The heart size and mediastinal contours are within normal limits.
Both lungs are clear. The visualized skeletal structures are
unremarkable.
IMPRESSION: No active cardiopulmonary disease.

## 2022-05-03 ENCOUNTER — Ambulatory Visit (INDEPENDENT_AMBULATORY_CARE_PROVIDER_SITE_OTHER): Payer: Managed Care, Other (non HMO) | Admitting: *Deleted

## 2022-05-03 VITALS — BP 144/84 | HR 79

## 2022-05-03 DIAGNOSIS — Z3480 Encounter for supervision of other normal pregnancy, unspecified trimester: Secondary | ICD-10-CM

## 2022-05-03 DIAGNOSIS — Z3200 Encounter for pregnancy test, result unknown: Secondary | ICD-10-CM

## 2022-05-03 DIAGNOSIS — Z3201 Encounter for pregnancy test, result positive: Secondary | ICD-10-CM

## 2022-05-03 DIAGNOSIS — N912 Amenorrhea, unspecified: Secondary | ICD-10-CM | POA: Diagnosis not present

## 2022-05-03 LAB — POCT URINE PREGNANCY: Preg Test, Ur: POSITIVE — AB

## 2022-05-03 MED ORDER — VITAFOL ULTRA 29-0.6-0.4-200 MG PO CAPS
1.0000 | ORAL_CAPSULE | Freq: Every day | ORAL | 12 refills | Status: AC
Start: 2022-05-03 — End: ?

## 2022-05-03 MED ORDER — PROMETHAZINE HCL 25 MG PO TABS: 25.0000 mg | ORAL_TABLET | Freq: Four times a day (QID) | ORAL | 1 refills | Status: DC | PRN

## 2022-05-03 NOTE — Progress Notes (Signed)
[name] presents today for UPT. She has no unusual complaints. LMP: 03/22/22    OBJECTIVE: Appears well, in no apparent distress.  OB History     Gravida  5   Para  4   Term  4   Preterm      AB  1   Living         SAB  1   IAB      Ectopic      Multiple      Live Births  4          Home UPT Result: positive In-Office UPT result: positive I have reviewed the patient's medical, obstetrical, social, and family histories, and medications.   ASSESSMENT: Positive pregnancy test  PLAN Prenatal care to be completed at: [place]. RX PNV and Phenergan sent. Ectopic and SAB precautions/ reasons to seek care in [facility] given.

## 2022-05-26 ENCOUNTER — Ambulatory Visit (INDEPENDENT_AMBULATORY_CARE_PROVIDER_SITE_OTHER): Payer: 59 | Admitting: Licensed Clinical Social Worker

## 2022-05-26 ENCOUNTER — Ambulatory Visit: Payer: Managed Care, Other (non HMO)

## 2022-05-26 ENCOUNTER — Ambulatory Visit (INDEPENDENT_AMBULATORY_CARE_PROVIDER_SITE_OTHER): Payer: Medicaid Other

## 2022-05-26 VITALS — BP 136/85 | HR 82 | Ht 64.0 in | Wt 204.2 lb

## 2022-05-26 DIAGNOSIS — O3680X0 Pregnancy with inconclusive fetal viability, not applicable or unspecified: Secondary | ICD-10-CM | POA: Diagnosis not present

## 2022-05-26 DIAGNOSIS — O09529 Supervision of elderly multigravida, unspecified trimester: Secondary | ICD-10-CM | POA: Diagnosis not present

## 2022-05-26 DIAGNOSIS — T7431XA Adult psychological abuse, confirmed, initial encounter: Secondary | ICD-10-CM | POA: Diagnosis not present

## 2022-05-26 DIAGNOSIS — T7411XA Adult physical abuse, confirmed, initial encounter: Secondary | ICD-10-CM

## 2022-05-26 DIAGNOSIS — Z87898 Personal history of other specified conditions: Secondary | ICD-10-CM | POA: Diagnosis not present

## 2022-05-26 DIAGNOSIS — Z9189 Other specified personal risk factors, not elsewhere classified: Secondary | ICD-10-CM

## 2022-05-26 DIAGNOSIS — Z6981 Encounter for mental health services for victim of other abuse: Secondary | ICD-10-CM | POA: Diagnosis not present

## 2022-05-26 DIAGNOSIS — F439 Reaction to severe stress, unspecified: Secondary | ICD-10-CM

## 2022-05-26 DIAGNOSIS — F339 Major depressive disorder, recurrent, unspecified: Secondary | ICD-10-CM | POA: Diagnosis not present

## 2022-05-26 MED ORDER — BLOOD PRESSURE KIT DEVI: 1.0000 | 0 refills | Status: DC

## 2022-05-26 NOTE — Progress Notes (Unsigned)
New OB Intake  I connected with [name] on 05/26/22 at  3:10 PM EDT by in person and verified that I am speaking with the correct person using two identifiers. Nurse is located at [facility] and pt is located at [place].  I discussed the limitations, risks, security and privacy concerns of performing an evaluation and management service by telephone and the availability of in person appointments. I also discussed with the patient that there may be a patient responsible charge related to this service. The patient expressed understanding and agreed to proceed.  I explained I am completing New OB Intake today. We discussed EDD undetermined at this time. Repeat u/s ordered and hcg level drawn today. Pt is [id number]/A2505. I reviewed her allergies, medications, Medical/Surgical/OB history, and appropriate screenings. I informed her of [facility] services. [facility] information placed in AVS. Based on history, this is a low risk pregnancy.  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 Patient given information for [facility] Healthy Baby website for more information about Women's and [place]. Patient is interested in water birth. Offered upcoming OB visit with CNM to discuss further.  MyChart/Babyscripts MyChart access verified. I explained pt will have some visits in office and some virtually. Babyscripts instructions given and order placed. Patient verifies receipt of registration text/e-mail. Account successfully created and app downloaded.  Blood Pressure Cuff/Weight Scale Patient has BP cuff at home. Explained after first prenatal appt pt will check weekly and document in 60. Patient does  have weight scale.  Anatomy US Explained first scheduled [place] will be around 19 weeks. Dating and viability u/s completed today. Anatomy US to be ordered at next visit.   Labs Discussed Johnsie Cancel genetic screening with patient. Would like both Panorama and Horizon drawn at new OB visit. Routine prenatal labs needed.  COVID Vaccine Patient has had COVID vaccine.   Is patient a CenteringPregnancy candidate?  Declined Declined due to Group setting Not a candidate due to  If accepted,   Social Determinants of Health Food Insecurity: Patient denies food insecurity. WIC Referral: Patient is interested in referral to [facility].  Transportation: Patient denies transportation needs. Childcare: Discussed no children allowed at ultrasound appointments. Offered childcare services; patient declines childcare services at this time.  First visit review I reviewed new OB appt with patient. I explained they will have a provider visit that includes prenatal labs, std screening, pap smear, genetic screening, and discuss plan of care for pregnancy. Explained pt will be seen by Fatima Blank at first visit; encounter routed to appropriate provider. Explained that patient will be seen by pregnancy navigator following visit with provider.   Lucianne Lei, RN 05/26/2022  3:21 PM
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
## 2022-05-27 LAB — BETA HCG QUANT (REF LAB): hCG Quant: 53106 m[IU]/mL

## 2022-05-27 NOTE — Progress Notes (Signed)
Patient was assessed and managed by nursing staff during this encounter. I have reviewed the chart and agree with the documentation and plan. I have also made any necessary editorial changes.  Griffin Basil, MD 05/27/2022 8:33 AM

## 2022-05-28 ENCOUNTER — Telehealth: Payer: Self-pay

## 2022-05-28 ENCOUNTER — Encounter (HOSPITAL_COMMUNITY): Payer: Self-pay | Admitting: Obstetrics and Gynecology

## 2022-05-28 ENCOUNTER — Inpatient Hospital Stay (HOSPITAL_COMMUNITY): Payer: Managed Care, Other (non HMO)

## 2022-05-28 ENCOUNTER — Other Ambulatory Visit: Payer: Self-pay

## 2022-05-28 ENCOUNTER — Telehealth: Payer: Self-pay | Admitting: *Deleted

## 2022-05-28 ENCOUNTER — Inpatient Hospital Stay (HOSPITAL_COMMUNITY)
Admission: AD | Admit: 2022-05-28 | Discharge: 2022-05-28 | Disposition: A | Discharge status: Home / Self Care | Payer: Managed Care, Other (non HMO) | Attending: Obstetrics and Gynecology | Admitting: Obstetrics and Gynecology

## 2022-05-28 DIAGNOSIS — O09529 Supervision of elderly multigravida, unspecified trimester: Secondary | ICD-10-CM

## 2022-05-28 DIAGNOSIS — O09521 Supervision of elderly multigravida, first trimester: Secondary | ICD-10-CM | POA: Insufficient documentation

## 2022-05-28 DIAGNOSIS — O021 Missed abortion: Secondary | ICD-10-CM | POA: Insufficient documentation

## 2022-05-28 DIAGNOSIS — Z3A08 8 weeks gestation of pregnancy: Secondary | ICD-10-CM | POA: Diagnosis not present

## 2022-05-28 DIAGNOSIS — Z3A09 9 weeks gestation of pregnancy: Secondary | ICD-10-CM | POA: Insufficient documentation

## 2022-05-28 LAB — URINALYSIS, ROUTINE W REFLEX MICROSCOPIC
Bilirubin Urine: NEGATIVE
Glucose, UA: NEGATIVE mg/dL
Ketones, ur: NEGATIVE mg/dL
Leukocytes,Ua: NEGATIVE
Nitrite: NEGATIVE
Protein, ur: NEGATIVE mg/dL
Specific Gravity, Urine: 1.016 (ref 1.005–1.030)
pH: 5 (ref 5.0–8.0)

## 2022-05-28 LAB — CBC
HCT: 37.4 % (ref 36.0–46.0)
Hemoglobin: 12.5 g/dL (ref 12.0–15.0)
MCH: 30.9 pg (ref 26.0–34.0)
MCHC: 33.4 g/dL (ref 30.0–36.0)
MCV: 92.3 fL (ref 80.0–100.0)
Platelets: 263 10*3/uL (ref 150–400)
RBC: 4.05 MIL/uL (ref 3.87–5.11)
RDW: 12.2 % (ref 11.5–15.5)
WBC: 7.7 10*3/uL (ref 4.0–10.5)
nRBC: 0 % (ref 0.0–0.2)

## 2022-05-28 LAB — HCG, QUANTITATIVE, PREGNANCY: hCG, Beta Chain, Quant, S: 62823 m[IU]/mL — ABNORMAL HIGH (ref <5)

## 2022-05-28 NOTE — MAU Provider Note (Signed)
History     CSN: [id number]  Arrival date and time: 05/28/22 1950   None     Chief Complaint  Patient presents with   Abdominal Pain   Vaginal Bleeding   HPI  [name] is a 41 y.o. [id number] at 78w4dwho presents for evaluation of vaginal bleeding. Patient reports she saw some blood in the toilet when she went to the bathroom this morning. She denies any more bleeding at this time. She also reports intermittent lower abdominal cramping. Patient rates the pain as a 5/10 and has not tried anything for the pain.  She denies any discharge and leaking of fluid. Denies any constipation, diarrhea or any urinary complaints.  She was seen in the office on 11/1 and told she may not have a normal pregnancy at that time.   OB History     Gravida  6   Para  4   Term  4   Preterm      AB  1   Living  4      SAB  1   IAB      Ectopic      Multiple      Live Births  4           Past Medical History:  Diagnosis Date   Anxiety    Asthma    Family history of adverse reaction to anesthesia    oldest son hyper with anesthesia at age 41  GERD (gastroesophageal reflux disease)    Headache    migraine   Panic attack    Pelvic pain    PTSD (post-traumatic stress disorder)     Past Surgical History:  Procedure Laterality Date   ADENOIDECTOMY     CESAREAN SECTION  01/29/1998   lap removal of adhesions  2008   removal of endometriosis   LAPAROSCOPY N/A 06/28/2019   Procedure: LAPAROSCOPY DIAGNOSTIC LYSIS OF ADHESIONS;  Surgeon: MArvella Nigh MD;  [facility]  Service: Gynecology;  Laterality: N/A;   lymph node disection     results benign   MYRINGOTOMY     reduction of turbinates     TONSILLECTOMY     WISDOM TOOTH EXTRACTION  02/2020    Family History  Problem Relation Age of Onset   Cancer Mother    Hepatitis C Mother    Hypertension Mother    Diabetes Mother    Uterine cancer Mother    Mitral valve prolapse Mother     Hepatitis C Father    Hypertension Father    Breast cancer Maternal Grandmother    Cancer - Other Maternal Grandmother    Diabetes Other    Hypertension Other    Arthritis Other     Social History   Tobacco Use   Smoking status: Former    Types: Cigarettes    Quit date: 02/2021    Years since quitting: 1.2   Smokeless tobacco: Never   Tobacco comments:    quit 04-22-1999 social   Vaping Use   Vaping Use: Never used  Substance Use Topics   Alcohol use: Not Currently    Comment: occ, not since confirmed pregnancy   Drug use: No    Allergies:  Allergies  Allergen Reactions   Clindamycin Rash and Shortness Of Breath    Other reaction(s): Other (See Comments) Elevated BP   Clindamycin/Lincomycin Shortness Of Breath, Rash and Other (See Comments)    Elevated BP   Penicillins Anaphylaxis and  Hives   Sulfa Antibiotics Rash   Clotrimazole     "cracks my skin open"   Monistat [Miconazole Nitrate-Wipes] Other (See Comments)    'cracks my skin open'   Neosporin [Neomycin-Bacitracin Zn-Polymyx] Other (See Comments)    'cracks my skin open'   Oxybutynin     Other reaction(s): Cramps (ALLERGY/intolerance) Pt had severe abdominal cramps   Betadine [Povidone Iodine] Hives and Rash   Povidone-Iodine Rash and Hives    Medications Prior to Admission  Medication Sig Dispense Refill Last Dose   acetaminophen (TYLENOL) 500 MG tablet Take 1,000 mg by mouth every 6 (six) hours as needed for mild pain or headache.      albuterol (PROVENTIL HFA;VENTOLIN HFA) 108 (90 BASE) MCG/ACT inhaler Inhale 1 puff into the lungs every 6 (six) hours as needed for wheezing or shortness of breath. (Patient not taking: Reported on 05/26/2022)      Blood Pressure Monitoring (BLOOD PRESSURE KIT) DEVI 1 kit by Does not apply route once a week. 1 each 0    cetirizine (ZYRTEC) 10 MG tablet Take 10 mg by mouth daily.      fluticasone (FLONASE) 50 MCG/ACT nasal spray Place 2 sprays into both nostrils daily.       KLOR-CON M20 20 MEQ tablet TAKE 1 TABLET BY MOUTH EVERY DAY 30 tablet 0    montelukast (SINGULAIR) 10 MG tablet Take 10 mg by mouth at bedtime.      Prenat-Fe Poly-Methfol-FA-DHA (VITAFOL ULTRA) 29-0.6-0.4-200 MG CAPS Take 1 tablet by mouth daily. 30 capsule 12    promethazine (PHENERGAN) 25 MG tablet Take 1 tablet (25 mg total) by mouth every 6 (six) hours as needed for nausea or vomiting. 30 tablet 1     Review of Systems  Constitutional: Negative.  Negative for fatigue and fever.  HENT: Negative.    Respiratory: Negative.  Negative for shortness of breath.   Cardiovascular: Negative.  Negative for chest pain.  Gastrointestinal:  Positive for abdominal pain. Negative for constipation, diarrhea, nausea and vomiting.  Genitourinary:  Positive for vaginal bleeding. Negative for dysuria and vaginal discharge.  Neurological: Negative.  Negative for dizziness and headaches.   Physical Exam   Blood pressure 124/68, pulse 89, temperature 98.3 F (36.8 C), temperature source Oral, resp. rate 16, height _0  (1.626 m), weight 92 kg, last menstrual period 03/22/2022, SpO2 100 %.  Patient Vitals for the past 24 hrs:  BP Temp Temp src Pulse Resp SpO2 Height Weight  05/28/22 0939 124/68 98.3 F (36.8 C) Oral 89 16 100 % -- --  05/28/22 0934 -- -- -- -- -- -- _1  (1.626 m) 92 kg    Physical Exam Vitals and nursing note reviewed.  Constitutional:      General: She is not in acute distress.    Appearance: She is well-developed.  HENT:     Head: Normocephalic.  Eyes:     Pupils: Pupils are equal, round, and reactive to light.  Cardiovascular:     Rate and Rhythm: Normal rate and regular rhythm.     Heart sounds: Normal heart sounds.  Pulmonary:     Effort: Pulmonary effort is normal. No respiratory distress.     Breath sounds: Normal breath sounds.  Abdominal:     General: Bowel sounds are normal. There is no distension.     Palpations: Abdomen is soft.     Tenderness: There is no  abdominal tenderness.  Skin:    General: Skin is warm and dry.  Neurological:     Mental Status: She is alert and oriented to person, place, and time.  Psychiatric:        Mood and Affect: Mood normal.        Behavior: Behavior normal.        Thought Content: Thought content normal.        Judgment: Judgment normal.      MAU Course  Procedures  Results for orders placed or performed during the hospital encounter of 05/28/22 (from the past 24 hour(s))  CBC     Status: None   Collection Time: 05/28/22  9:34 AM  Result Value Ref Range   WBC 7.7 4.0 - 10.5 K/uL   RBC 4.05 3.87 - 5.11 MIL/uL   Hemoglobin 12.5 12.0 - 15.0 g/dL   HCT 37.4 36.0 - 46.0 %   MCV 92.3 80.0 - 100.0 fL   MCH 30.9 26.0 - 34.0 pg   MCHC 33.4 30.0 - 36.0 g/dL   RDW 12.2 11.5 - 15.5 %   Platelets 263 150 - 400 K/uL   nRBC 0.0 0.0 - 0.2 %  hCG, quantitative, pregnancy     Status: Abnormal   Collection Time: 05/28/22  9:34 AM  Result Value Ref Range   hCG, Beta Chain, Quant, S 62,823 (H) <5 mIU/mL     US OB LESS THAN 14 WEEKS WITH OB TRANSVAGINAL  Result Date: 05/28/2022 CLINICAL DATA:  Vaginal bleeding EXAM: OBSTETRIC <14 WK [place] AND TRANSVAGINAL OB US TECHNIQUE: Both transabdominal and transvaginal ultrasound examinations were performed for complete evaluation of the gestation as well as the maternal uterus, adnexal regions, and pelvic cul-de-sac. Transvaginal technique was performed to assess early pregnancy. COMPARISON:  None Available. FINDINGS: Intrauterine gestational sac: Single Yolk sac:  Not Visualized. Embryo:  Not Visualized. Cardiac Activity: Not Visualized. MSD: 31.8 mm   8 w   3 d Subchorionic hemorrhage:  None visualized. Maternal uterus/adnexae: Normal appearance of the bilateral ovaries. Trace free fluid seen in the pelvis. IMPRESSION: Mean sac diameter of greater than 25 mm with no embryo visualized. Findings are diagnostic of pregnancy failure. Electronically Signed   By: Yetta Glassman M.D.    On: 05/28/2022 11:06     MDM Labs ordered and reviewed.   UA, UPT CBC, HCG ABO/Rh- O Pos US OB Comp Less 14 weeks with Transvaginal  CNM informed patient of results of ultrasound showing missed AB. Condolences provided and space given for patient to process emotions. CNM discussed options for management including expectant management, cytotec and surgical management. Risks and benefits of each reviewed at length. Patient desires surgical management and verbalized understanding of risks and benefits of this method.   Message sent to schedule surgery  Assessment and Plan   1. Supervision of elderly multigravida, antepartum, unspecified trimester   2. Missed abortion   3. [redacted] weeks gestation of pregnancy     -Discharge home in stable condition -Miscarriage precautions discussed -Patient advised to follow-up with OB next week for surgery -Patient may return to MAU as needed or if her condition were to change or worsen  Wende Mott, CNM 05/28/2022, 10:47 AM
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
## 2022-05-28 NOTE — MAU Note (Signed)
[name] is a 41 y.o. at [redacted]w[redacted]d here in MAU reporting: this morning used the bathroom and noticed some bleeding into the toilet and when she wiped. Unsure if still bleeding but is having some cramping. Last IC was this morning prior to bleeding started.   Onset of complaint: today  Pain score: 5/10  Vitals:   05/28/22 0939  BP: 124/68  Pulse: 89  Resp: 16  Temp: 98.3 F (36.8 C)  SpO2: 100%     FHT:NA  Lab orders placed from triage: entered by provider

## 2022-05-28 NOTE — Telephone Encounter (Signed)
Called and spoke with patient, surgery date, time location and preop instructions given, patient expressed understanding.

## 2022-05-28 NOTE — Progress Notes (Signed)
[name] deneis chest pain or shortness of breath.  Patient denies having any s/s of Covid in her household, also denies any known exposure to Covid.

## 2022-05-31 ENCOUNTER — Encounter (HOSPITAL_COMMUNITY)
Admission: RE | Disposition: A | Discharge status: Home / Self Care | Payer: Self-pay | Source: Home / Self Care | Attending: Obstetrics and Gynecology

## 2022-05-31 ENCOUNTER — Ambulatory Visit (HOSPITAL_COMMUNITY)
Admission: RE | Admit: 2022-05-31 | Discharge: 2022-05-31 | Disposition: A | Discharge status: Home / Self Care | Payer: Managed Care, Other (non HMO) | Attending: Obstetrics and Gynecology | Admitting: Obstetrics and Gynecology

## 2022-05-31 ENCOUNTER — Ambulatory Visit (HOSPITAL_BASED_OUTPATIENT_CLINIC_OR_DEPARTMENT_OTHER): Payer: Managed Care, Other (non HMO) | Admitting: Anesthesiology

## 2022-05-31 ENCOUNTER — Encounter (HOSPITAL_COMMUNITY): Payer: Self-pay | Admitting: Obstetrics and Gynecology

## 2022-05-31 ENCOUNTER — Ambulatory Visit (HOSPITAL_COMMUNITY): Payer: Managed Care, Other (non HMO) | Admitting: Anesthesiology

## 2022-05-31 ENCOUNTER — Other Ambulatory Visit: Payer: Self-pay

## 2022-05-31 DIAGNOSIS — K219 Gastro-esophageal reflux disease without esophagitis: Secondary | ICD-10-CM | POA: Insufficient documentation

## 2022-05-31 DIAGNOSIS — Z87891 Personal history of nicotine dependence: Secondary | ICD-10-CM | POA: Diagnosis not present

## 2022-05-31 DIAGNOSIS — O021 Missed abortion: Secondary | ICD-10-CM | POA: Diagnosis present

## 2022-05-31 DIAGNOSIS — O99519 Diseases of the respiratory system complicating pregnancy, unspecified trimester: Secondary | ICD-10-CM

## 2022-05-31 DIAGNOSIS — O09529 Supervision of elderly multigravida, unspecified trimester: Secondary | ICD-10-CM

## 2022-05-31 DIAGNOSIS — J45909 Unspecified asthma, uncomplicated: Secondary | ICD-10-CM

## 2022-05-31 HISTORY — PX: DILATION AND EVACUATION: SHX1459

## 2022-05-31 HISTORY — DX: Depression, unspecified: F32.A

## 2022-05-31 LAB — TYPE AND SCREEN
ABO/RH(D): O POS
Antibody Screen: NEGATIVE

## 2022-05-31 SURGERY — DILATION AND EVACUATION, UTERUS
Anesthesia: Monitor Anesthesia Care

## 2022-05-31 MED ORDER — ACETAMINOPHEN 500 MG PO TABS: 1000.0000 mg | ORAL_TABLET | ORAL | Status: DC

## 2022-05-31 MED ORDER — 0.9 % SODIUM CHLORIDE (POUR BTL) OPTIME
TOPICAL | Status: DC | PRN
  Administered 2022-05-31: 1000 mL

## 2022-05-31 MED ORDER — ONDANSETRON HCL 4 MG/2ML IJ SOLN
INTRAMUSCULAR | Status: DC | PRN
  Administered 2022-05-31: 4 mg via INTRAVENOUS

## 2022-05-31 MED ORDER — ACETAMINOPHEN 500 MG PO TABS: 1000.0000 mg | ORAL_TABLET | Freq: Once | ORAL | Status: AC

## 2022-05-31 MED ORDER — PROPOFOL 500 MG/50ML IV EMUL
INTRAVENOUS | Status: DC | PRN
  Administered 2022-05-31: 100 ug/kg/min via INTRAVENOUS

## 2022-05-31 MED ORDER — MIDAZOLAM HCL 5 MG/5ML IJ SOLN
INTRAMUSCULAR | Status: DC | PRN
  Administered 2022-05-31: 2 mg via INTRAVENOUS

## 2022-05-31 MED ORDER — MIDAZOLAM HCL 2 MG/2ML IJ SOLN
INTRAMUSCULAR | Status: AC
  Filled 2022-05-31: qty 2

## 2022-05-31 MED ORDER — PROPOFOL 10 MG/ML IV BOLUS
INTRAVENOUS | Status: AC
  Filled 2022-05-31: qty 20

## 2022-05-31 MED ORDER — KETOROLAC TROMETHAMINE 15 MG/ML IJ SOLN
15.0000 mg | INTRAMUSCULAR | Status: AC
  Administered 2022-05-31: 15 mg via INTRAVENOUS
  Filled 2022-05-31: qty 1

## 2022-05-31 MED ORDER — GLYCOPYRROLATE PF 0.2 MG/ML IJ SOSY
PREFILLED_SYRINGE | INTRAMUSCULAR | Status: AC
  Filled 2022-05-31: qty 1

## 2022-05-31 MED ORDER — ROCURONIUM BROMIDE 10 MG/ML (PF) SYRINGE
PREFILLED_SYRINGE | INTRAVENOUS | Status: AC
  Filled 2022-05-31: qty 10

## 2022-05-31 MED ORDER — PROPOFOL 10 MG/ML IV BOLUS
INTRAVENOUS | Status: DC | PRN
  Administered 2022-05-31: 50 mg via INTRAVENOUS

## 2022-05-31 MED ORDER — DOXYCYCLINE HYCLATE 100 MG IV SOLR
200.0000 mg | INTRAVENOUS | Status: AC
  Administered 2022-05-31: 200 mg via INTRAVENOUS
  Filled 2022-05-31: qty 200

## 2022-05-31 MED ORDER — ACETAMINOPHEN 325 MG PO TABS: 650.0000 mg | ORAL_TABLET | Freq: Four times a day (QID) | ORAL | 0 refills | Status: AC | PRN

## 2022-05-31 MED ORDER — GLYCOPYRROLATE 0.2 MG/ML IJ SOLN
INTRAMUSCULAR | Status: DC | PRN
  Administered 2022-05-31: .2 mg via INTRAVENOUS

## 2022-05-31 MED ORDER — CEFAZOLIN SODIUM-DEXTROSE 2-4 GM/100ML-% IV SOLN
INTRAVENOUS | Status: AC
  Filled 2022-05-31: qty 100

## 2022-05-31 MED ORDER — ONDANSETRON HCL 4 MG/2ML IJ SOLN: 4.0000 mg | Freq: Once | INTRAMUSCULAR | Status: DC | PRN

## 2022-05-31 MED ORDER — LIDOCAINE-EPINEPHRINE 1 %-1:100000 IJ SOLN
INTRAMUSCULAR | Status: AC
  Filled 2022-05-31: qty 1

## 2022-05-31 MED ORDER — ACETAMINOPHEN 500 MG PO TABS
ORAL_TABLET | ORAL | Status: AC
  Administered 2022-05-31: 1000 mg via ORAL
  Filled 2022-05-31: qty 2

## 2022-05-31 MED ORDER — AMISULPRIDE (ANTIEMETIC) 5 MG/2ML IV SOLN: 10.0000 mg | Freq: Once | INTRAVENOUS | Status: DC | PRN

## 2022-05-31 MED ORDER — OXYCODONE HCL 5 MG PO TABS: 5.0000 mg | ORAL_TABLET | Freq: Once | ORAL | Status: DC | PRN

## 2022-05-31 MED ORDER — IBUPROFEN 800 MG PO TABS: 800.0000 mg | ORAL_TABLET | Freq: Three times a day (TID) | ORAL | 3 refills | Status: DC | PRN

## 2022-05-31 MED ORDER — CHLORHEXIDINE GLUCONATE 0.12 % MT SOLN
OROMUCOSAL | Status: AC
  Administered 2022-05-31: 15 mL
  Filled 2022-05-31: qty 15

## 2022-05-31 MED ORDER — LIDOCAINE-EPINEPHRINE 1 %-1:100000 IJ SOLN
INTRAMUSCULAR | Status: DC | PRN
  Administered 2022-05-31: 20 mL

## 2022-05-31 MED ORDER — DEXAMETHASONE SODIUM PHOSPHATE 10 MG/ML IJ SOLN
INTRAMUSCULAR | Status: AC
  Filled 2022-05-31: qty 1

## 2022-05-31 MED ORDER — FENTANYL CITRATE (PF) 250 MCG/5ML IJ SOLN
INTRAMUSCULAR | Status: AC
  Filled 2022-05-31: qty 5

## 2022-05-31 MED ORDER — ONDANSETRON HCL 4 MG/2ML IJ SOLN
INTRAMUSCULAR | Status: AC
  Filled 2022-05-31: qty 2

## 2022-05-31 MED ORDER — OXYCODONE HCL 5 MG/5ML PO SOLN: 5.0000 mg | Freq: Once | ORAL | Status: DC | PRN

## 2022-05-31 MED ORDER — LACTATED RINGERS IV SOLN: INTRAVENOUS | Status: DC | PRN

## 2022-05-31 MED ORDER — FENTANYL CITRATE (PF) 100 MCG/2ML IJ SOLN: 25.0000 ug | INTRAMUSCULAR | Status: DC | PRN

## 2022-05-31 SURGICAL SUPPLY — 21 items
FILTER UTR ASPR ASSEMBLY (MISCELLANEOUS) ×1 IMPLANT
GAUZE 4X4 16PLY ~~LOC~~+RFID DBL (SPONGE) ×1 IMPLANT
GLOVE BIOGEL PI IND STRL 7.0 (GLOVE) ×2 IMPLANT
GLOVE ECLIPSE 6.5 STRL STRAW (GLOVE) ×2 IMPLANT
GLOVE SURG ENC MOIS LTX SZ6 (GLOVE) ×1 IMPLANT
GOWN STRL REUS W/ TWL LRG LVL3 (GOWN DISPOSABLE) ×2 IMPLANT
GOWN STRL REUS W/TWL LRG LVL3 (GOWN DISPOSABLE) ×2
HOSE CONNECTING 18IN BERKELEY (TUBING) ×1 IMPLANT
KIT BERKELEY 1ST TRI 3/8 NO TR (MISCELLANEOUS) ×1 IMPLANT
KIT BERKELEY 1ST TRIMESTER 3/8 (MISCELLANEOUS) ×1 IMPLANT
NS IRRIG 1000ML POUR BTL (IV SOLUTION) ×1 IMPLANT
PACK VAGINAL MINOR WOMEN LF (CUSTOM PROCEDURE TRAY) ×1 IMPLANT
PAD OB MATERNITY 4.3X12.25 (PERSONAL CARE ITEMS) ×1 IMPLANT
SET BERKELEY SUCTION TUBING (SUCTIONS) ×1 IMPLANT
SPIKE FLUID TRANSFER (MISCELLANEOUS) ×1 IMPLANT
TOWEL GREEN STERILE FF (TOWEL DISPOSABLE) ×1 IMPLANT
UNDERPAD 30X36 HEAVY ABSORB (UNDERPADS AND DIAPERS) ×1 IMPLANT
VACURETTE 10 RIGID CVD (CANNULA) IMPLANT
VACURETTE 7MM CVD STRL WRAP (CANNULA) IMPLANT
VACURETTE 8 RIGID CVD (CANNULA) IMPLANT
VACURETTE 9 RIGID CVD (CANNULA) IMPLANT

## 2022-05-31 NOTE — Anesthesia Preprocedure Evaluation (Addendum)
Anesthesia Evaluation  Patient identified by MRN, date of birth, ID band Patient awake    Reviewed: Allergy & Precautions, NPO status , Patient's Chart, lab work & pertinent test results  Airway Mallampati: II  TM Distance: >3 FB Neck ROM: Full    Dental  (+) Teeth Intact, Dental Advisory Given   Pulmonary asthma , former smoker   breath sounds clear to auscultation       Cardiovascular  Rhythm:Regular Rate:Normal     Neuro/Psych  Headaches PSYCHIATRIC DISORDERS Anxiety Depression       GI/Hepatic Neg liver ROS,GERD  ,,  Endo/Other  negative endocrine ROS    Renal/GU negative Renal ROS     Musculoskeletal negative musculoskeletal ROS (+)    Abdominal Normal abdominal exam  (+)   Peds  Hematology negative hematology ROS (+)   Anesthesia Other Findings   Reproductive/Obstetrics                             Anesthesia Physical Anesthesia Plan  ASA: 2  Anesthesia Plan: MAC   Post-op Pain Management:    Induction: Intravenous  PONV Risk Score and Plan:   Airway Management Planned: Natural Airway and Simple Face Mask  Additional Equipment: None  Intra-op Plan:   Post-operative Plan:   Informed Consent: I have reviewed the patients History and Physical, chart, labs and discussed the procedure including the risks, benefits and alternatives for the proposed anesthesia with the patient or authorized representative who has indicated his/her understanding and acceptance.       Plan Discussed with: CRNA  Anesthesia Plan Comments:        Anesthesia Quick Evaluation

## 2022-05-31 NOTE — Op Note (Signed)
Preop Diagnosis: Missed abortion Postop Diagnosis: Same Procedure: Dilation and evacuation Surgeon: Dr. Damita Dunnings Assist: Dr. Dema Severin Anesthesia: IV Sedation, 1% lidocaine with epinephrine for paracervical block  EBL: 25 cc IVF: 700 cc  UOP: not drained Complications: None  Findings: Normal anteverted uterus, products of conception noted  Description of the procedure: Preop antibiotics of doxycycline given. Informed consent reviewed and signed. Pt given opportunity to ask questions.   Pt prepped and draped in the dorsal lithotomy fashion after LMA anesthesia found to be adequate. Timeout performed.   Weighted speculum placed into the vagina. Cervix grasped with a single tooth tenaculum. 1% lidocaine with epinephrine for paracervical block performed for total of 20 cc at 4 and 8 oclock.  Cervix progressively dilated to a 31 pratt.  I used a 9 mm rigid curette. Several passes done with the suction curette and the tissue was retrieved. A gentle pass was done with a 1 curette. A gritty texture noted in all areas of the uterus confirming complete evacuation. Minimal bleeding noted. Procedure completed. All instruments removed. Counts correct x2.   Products of conception sent to pathology. Accepts genetics. Rh Positive - rhogam not indicated  Pt taken to recovery room in stable condition.  Radene Gunning, MD Attending [place], [facility] for [facility], [place]

## 2022-05-31 NOTE — Progress Notes (Signed)
Patient completed antibiotics awaiting family to transport home

## 2022-05-31 NOTE — Anesthesia Postprocedure Evaluation (Signed)
Anesthesia Post Note  Patient: Aoife Bold M[place]na  Procedure(s) Performed: DILATATION AND EVACUATION     Patient location during evaluation: PACU Anesthesia Type: MAC Level of consciousness: awake and alert Pain management: pain level controlled Vital Signs Assessment: post-procedure vital signs reviewed and stable Respiratory status: spontaneous breathing, nonlabored ventilation, respiratory function stable and patient connected to nasal cannula oxygen Cardiovascular status: stable and blood pressure returned to baseline Postop Assessment: no apparent nausea or vomiting Anesthetic complications: no   No notable events documented.  Last Vitals:  Vitals:   05/31/22 1500 05/31/22 1515  BP: 112/61 118/66  Pulse: 84 81  Resp: 16 16  Temp:    SpO2: 100% 99%    Last Pain:  Vitals:   05/31/22 1515  TempSrc:   PainSc: 0-No pain                 Effie Berkshire

## 2022-05-31 NOTE — Transfer of Care (Signed)
Immediate Anesthesia Transfer of Care Note  Patient: [name]  Procedure(s) Performed: DILATATION AND EVACUATION  Patient Location: PACU  Anesthesia Type:MAC  Level of Consciousness: awake, alert , and oriented  Airway & Oxygen Therapy: Patient Spontanous Breathing  Post-op Assessment: Report given to RN, Post -op Vital signs reviewed and stable, Patient moving all extremities X 4, and Patient able to stick tongue midline  Post vital signs: Reviewed  Last Vitals:  Vitals Value Taken Time  BP 116/90 05/31/22 1448  Temp 97.7   Pulse 91 05/31/22 1449  Resp 20 05/31/22 1449  SpO2 100 % 05/31/22 1449  Vitals shown include unvalidated device data.  Last Pain:  Vitals:   05/31/22 1230  TempSrc:   PainSc: 5       Patients Stated Pain Goal: 0 (35/67/01 4103)  Complications: No notable events documented.

## 2022-05-31 NOTE — H&P (Signed)
Faculty [facility] Obstetrics and Gynecology Attending History and Physical  [name] is a 41 y.o. [id number] who presented to the MAU on 11/3 for evaluation of vaginal bleeding. She was 8w by dates. She had an [place] which showed GS >45m without YS or FP which is definitive for pregnancy loss. Options had been discussed with the patient in the MAU and she desired surgical management with [facility]    Past Medical History:  Diagnosis Date   Anxiety    Asthma    Depression    Family history of adverse reaction to anesthesia    oldest son hyper with anesthesia at age 41  GERD (gastroesophageal reflux disease)    Headache    migraine   Panic attack    Pelvic pain    PTSD (post-traumatic stress disorder)    Past Surgical History:  Procedure Laterality Date   ADENOIDECTOMY     CESAREAN SECTION  01/29/1998   lap removal of adhesions  2008   removal of endometriosis   LAPAROSCOPY N/A 06/28/2019   Procedure: LAPAROSCOPY DIAGNOSTIC LYSIS OF ADHESIONS;  Surgeon: MArvella Nigh MD;  [place]  Service: Gynecology;  Laterality: N/A;   lymph node disection     results benign   MYRINGOTOMY     reduction of turbinates     TONSILLECTOMY     WISDOM TOOTH EXTRACTION  02/2020   OB History  Gravida Para Term Preterm AB Living  _0 SAB IAB Ectopic Multiple Live Births  1       4    # Outcome Date GA Lbr Len/2nd Weight Sex Delivery Anes PTL Lv  6 Gravida           5 Term 10/10/10    F Vag-Spont   LIV  4 SAB 2010          3 Term 07/19/07    M Vag-Spont   LIV  2 Term 12/18/01    F Vag-Spont   LIV  1 Term 01/29/98 453w0d M CS-LTranv   LIV     Complications: Fetal Intolerance  Patient denies any other pertinent gynecologic issues.  No current facility-administered medications on file prior to encounter.   Current Outpatient Medications on File Prior to Encounter  Medication Sig Dispense Refill   acetaminophen (TYLENOL) 500 MG  tablet Take 1,000 mg by mouth every 6 (six) hours as needed for mild pain or headache.     Prenat-Fe Poly-Methfol-FA-DHA (VITAFOL ULTRA) 29-0.6-0.4-200 MG CAPS Take 1 tablet by mouth daily. 30 capsule 12   promethazine (PHENERGAN) 25 MG tablet Take 1 tablet (25 mg total) by mouth every 6 (six) hours as needed for nausea or vomiting. 30 tablet 1   albuterol (PROVENTIL HFA;VENTOLIN HFA) 108 (90 BASE) MCG/ACT inhaler Inhale 1 puff into the lungs every 6 (six) hours as needed for wheezing or shortness of breath.     Blood Pressure Monitoring (BLOOD PRESSURE KIT) DEVI 1 kit by Does not apply route once a week. 1 each 0   cetirizine (ZYRTEC) 10 MG tablet Take 10 mg by mouth daily as needed for allergies.     fluticasone (FLONASE) 50 MCG/ACT nasal spray Place 2 sprays into both nostrils daily as needed for allergies.     Allergies  Allergen Reactions   Clindamycin Rash and Shortness Of Breath    Other reaction(s): Other (See Comments) Elevated BP   Clindamycin/Lincomycin Shortness Of Breath,  Rash and Other (See Comments)    Elevated BP   Penicillins Anaphylaxis and Hives   Sulfa Antibiotics Rash   Clotrimazole     "cracks my skin open"   Latex Hives   Monistat [Miconazole Nitrate-Wipes] Other (See Comments)    'cracks my skin open'   Neosporin [Neomycin-Bacitracin Zn-Polymyx] Other (See Comments)    'cracks my skin open'   Oxybutynin     Other reaction(s): Cramps (ALLERGY/intolerance) Pt had severe abdominal cramps   Betadine [Povidone Iodine] Hives and Rash   Povidone-Iodine Rash and Hives    Social History:   reports that she quit smoking about 14 months ago. Her smoking use included cigarettes. She has never used smokeless tobacco. She reports that she does not currently use alcohol. She reports that she does not use drugs. Family History  Problem Relation Age of Onset   Cancer Mother    Hepatitis C Mother    Hypertension Mother    Diabetes Mother    Uterine cancer Mother    Mitral  valve prolapse Mother    Hepatitis C Father    Hypertension Father    Breast cancer Maternal Grandmother    Cancer - Other Maternal Grandmother    Diabetes Other    Hypertension Other    Arthritis Other     Review of Systems: Pertinent items noted in HPI and remainder of comprehensive ROS otherwise negative.  PHYSICAL EXAM: Blood pressure 129/77, pulse 84, temperature 98.9 F (37.2 C), temperature source Oral, resp. rate 18, height _0  (1.626 m), weight 91.6 kg, last menstrual period 03/22/2022, SpO2 100 %, unknown if currently breastfeeding. CONSTITUTIONAL: Well-developed, well-nourished female in no acute distress.  HENT:  Normocephalic, atraumatic, External right and left ear normal. Oropharynx is clear and moist EYES: Conjunctivae and EOM are normal. Pupils are equal, round, and reactive to light. No scleral icterus.  NECK: Normal range of motion, supple, no masses SKIN: Skin is warm and dry. No rash noted. Not diaphoretic. No erythema. No pallor. NEUROLOGIC: Alert and oriented to person, place, and time. Normal reflexes, muscle tone coordination. No cranial nerve deficit noted. PSYCHIATRIC: Normal mood and affect. Normal behavior. Normal judgment and thought content. CARDIOVASCULAR: Normal heart rate noted, regular rhythm RESPIRATORY: Effort and breath sounds normal, no problems with respiration noted ABDOMEN: Soft, nontender, nondistended. PELVIC: Not examined MUSCULOSKELETAL: Normal range of motion. No tenderness.  No cyanosis, clubbing, or edema.  2+ distal pulses.  Labs: Results for orders placed or performed during the hospital encounter of 05/28/22 (from the past 336 hour(s))  CBC   Collection Time: 05/28/22  9:34 AM  Result Value Ref Range   WBC 7.7 4.0 - 10.5 K/uL   RBC 4.05 3.87 - 5.11 MIL/uL   Hemoglobin 12.5 12.0 - 15.0 g/dL   HCT 37.4 36.0 - 46.0 %   MCV 92.3 80.0 - 100.0 fL   MCH 30.9 26.0 - 34.0 pg   MCHC 33.4 30.0 - 36.0 g/dL   RDW 12.2 11.5 - 15.5 %    Platelets 263 150 - 400 K/uL   nRBC 0.0 0.0 - 0.2 %  hCG, quantitative, pregnancy   Collection Time: 05/28/22  9:34 AM  Result Value Ref Range   hCG, Beta Chain, Quant, S 62,823 (H) <5 mIU/mL  Urinalysis, Routine w reflex microscopic   Collection Time: 05/28/22 10:02 AM  Result Value Ref Range   Color, Urine YELLOW YELLOW   APPearance HAZY (A) CLEAR   Specific Gravity, Urine 1.016 1.005 - 1.030  pH 5.0 5.0 - 8.0   Glucose, UA NEGATIVE NEGATIVE mg/dL   Hgb urine dipstick SMALL (A) NEGATIVE   Bilirubin Urine NEGATIVE NEGATIVE   Ketones, ur NEGATIVE NEGATIVE mg/dL   Protein, ur NEGATIVE NEGATIVE mg/dL   Nitrite NEGATIVE NEGATIVE   Leukocytes,Ua NEGATIVE NEGATIVE   RBC / HPF 0-5 0 - 5 RBC/hpf   WBC, UA 0-5 0 - 5 WBC/hpf   Bacteria, UA RARE (A) NONE SEEN   Squamous Epithelial / LPF 0-5 0 - 5   Mucus PRESENT   Results for orders placed or performed in visit on 05/26/22 (from the past 336 hour(s))  Beta hCG quant (ref lab)   Collection Time: 05/26/22  4:34 PM  Result Value Ref Range   hCG Quant 53,106 mIU/mL    Imaging Studies: US OB LESS THAN 14 WEEKS WITH OB TRANSVAGINAL  Result Date: 05/28/2022 CLINICAL DATA:  Vaginal bleeding EXAM: OBSTETRIC <14 WK [place] AND TRANSVAGINAL OB US TECHNIQUE: Both transabdominal and transvaginal ultrasound examinations were performed for complete evaluation of the gestation as well as the maternal uterus, adnexal regions, and pelvic cul-de-sac. Transvaginal technique was performed to assess early pregnancy. COMPARISON:  None Available. FINDINGS: Intrauterine gestational sac: Single Yolk sac:  Not Visualized. Embryo:  Not Visualized. Cardiac Activity: Not Visualized. MSD: 31.8 mm   8 w   3 d Subchorionic hemorrhage:  None visualized. Maternal uterus/adnexae: Normal appearance of the bilateral ovaries. Trace free fluid seen in the pelvis. IMPRESSION: Mean sac diameter of greater than 25 mm with no embryo visualized. Findings are diagnostic of pregnancy  failure. Electronically Signed   By: Yetta Glassman M.D.   On: 05/28/2022 11:06    Assessment: Active Problems:   Missed abortion   Plan: - We discussed genetics: we reviewed the benefits - she Accepts - Risks of surgery include but are not limited to: bleeding, infection, injury to surrounding organs/tissues (i.e. bowel/bladder/ureters), need for additional procedures, wound complications, hospital re-admission, and conversion to open surgery - We discussed postop restrictions, precautions and expectations. We discussed typical hospital course and stay.  - She is RH positive - Doxycycline preop.  - Consent reviewed and signed.   Radene Gunning, MD, [place] for [facility], [place]

## 2022-06-01 ENCOUNTER — Encounter (HOSPITAL_COMMUNITY): Payer: Self-pay | Admitting: Obstetrics and Gynecology

## 2022-06-02 LAB — SURGICAL PATHOLOGY

## 2022-06-07 ENCOUNTER — Other Ambulatory Visit: Payer: Managed Care, Other (non HMO)

## 2022-06-07 ENCOUNTER — Telehealth: Payer: Self-pay | Admitting: Emergency Medicine

## 2022-06-07 NOTE — Telephone Encounter (Signed)
Pt had a D&C on this patient on 11/6. Today she called our office reporting sharp pain about 2-3 inches below the umbilicus. She endorses some nausea but denies fever, vomiting, vaginal odor or abnormal discharge or vaginal bleeding. She does have relief with ibuprofen and heating pad but states pain is waking her up through the night.  Staff message sent to Dr. Para March to advise.  Pt has apt with Dr. Para March on 11/16.  ED precautions given.

## 2022-06-09 NOTE — Progress Notes (Unsigned)
   GYNECOLOGY OFFICE VISIT NOTE  History:   [name] is a 41 y.o. [id number] here today for almost 2 week postop check following D&E s/p missed abortion at 8 weeks by dates. She had her surgery on 11/6 which proceeded without complication. She noted some periumbilical pain on 11/13 about 2-3 inches below her umbilicus.   Per nurse telephone note: She endorses some nausea but denies fever, vomiting, vaginal odor or abnormal discharge or vaginal bleeding. She does have relief with ibuprofen and heating pad but states pain is waking her up through the night.   Her pathology noted POC only which was expected. She had genetics collected which are still pending.   The following portions of the patient's history were reviewed and updated as appropriate: allergies, current medications, past family history, past medical history, past social history, past surgical history and problem list.  Review of Systems:  Pertinent items noted in HPI and remainder of comprehensive ROS otherwise negative.  Physical Exam:  There were no vitals taken for this visit. CONSTITUTIONAL: Well-developed, well-nourished female in no acute distress.  HEENT:  Normocephalic, atraumatic. External right and left ear normal. No scleral icterus.  NECK: Normal range of motion, supple, no masses noted on observation SKIN: No rash noted. Not diaphoretic. No erythema. No pallor. MUSCULOSKELETAL: Normal range of motion. No edema noted. NEUROLOGIC: Alert and oriented to person, place, and time. Normal muscle tone coordination. No cranial nerve deficit noted. PSYCHIATRIC: Normal mood and affect. Normal behavior. Normal judgment and thought content.  PELVIC: {Blank single:19197::"Deferred","Normal appearing external genitalia; normal urethral meatus; normal appearing vaginal mucosa and cervix.  No abnormal discharge noted.  Normal uterine size, no other palpable masses, no uterine or adnexal tenderness. Performed in the presence  of a chaperone"}  Assessment and Plan:   1. Postop check - Check CBC - Otherwise patient reassured as no evidence or concern for complication from surgery.  - f/u prn    Diagnoses and all orders for this visit:  Postop check    Routine preventative health maintenance measures emphasized. Please refer to After Visit Summary for other counseling recommendations.   No follow-ups on file.  Milas Hock, MD, FACOG Obstetrician & Gynecologist, [facility] for [facility], [facility]

## 2022-06-10 ENCOUNTER — Ambulatory Visit (INDEPENDENT_AMBULATORY_CARE_PROVIDER_SITE_OTHER): Payer: Managed Care, Other (non HMO) | Admitting: Obstetrics and Gynecology

## 2022-06-10 ENCOUNTER — Encounter: Payer: Self-pay | Admitting: Obstetrics and Gynecology

## 2022-06-10 ENCOUNTER — Ambulatory Visit (INDEPENDENT_AMBULATORY_CARE_PROVIDER_SITE_OTHER): Payer: Managed Care, Other (non HMO)

## 2022-06-10 ENCOUNTER — Other Ambulatory Visit (HOSPITAL_COMMUNITY)
Admission: RE | Admit: 2022-06-10 | Discharge: 2022-06-10 | Disposition: A | Discharge status: Home / Self Care | Payer: Managed Care, Other (non HMO) | Source: Ambulatory Visit | Attending: Obstetrics and Gynecology | Admitting: Obstetrics and Gynecology

## 2022-06-10 VITALS — BP 123/77 | HR 89 | Resp 16 | Ht 64.0 in | Wt 205.0 lb

## 2022-06-10 DIAGNOSIS — Z09 Encounter for follow-up examination after completed treatment for conditions other than malignant neoplasm: Secondary | ICD-10-CM | POA: Diagnosis present

## 2022-06-10 DIAGNOSIS — G8918 Other acute postprocedural pain: Secondary | ICD-10-CM

## 2022-06-10 DIAGNOSIS — B3731 Acute candidiasis of vulva and vagina: Secondary | ICD-10-CM | POA: Diagnosis not present

## 2022-06-10 DIAGNOSIS — R102 Pelvic and perineal pain: Secondary | ICD-10-CM | POA: Diagnosis not present

## 2022-06-10 LAB — CBC
HCT: 38.4 % (ref 35.0–45.0)
Hemoglobin: 12.9 g/dL (ref 11.7–15.5)
MCH: 31 pg (ref 27.0–33.0)
MCHC: 33.6 g/dL (ref 32.0–36.0)
MCV: 92.3 fL (ref 80.0–100.0)
MPV: 10.5 fL (ref 7.5–12.5)
Platelets: 268 10*3/uL (ref 140–400)
RBC: 4.16 10*6/uL (ref 3.80–5.10)
RDW: 11.9 % (ref 11.0–15.0)
WBC: 6.6 10*3/uL (ref 3.8–10.8)

## 2022-06-10 MED ORDER — CYCLOBENZAPRINE HCL 10 MG PO TABS: 10.0000 mg | ORAL_TABLET | Freq: Three times a day (TID) | ORAL | 0 refills | Status: AC | PRN

## 2022-06-11 LAB — CERVICOVAGINAL ANCILLARY ONLY
Bacterial Vaginitis (gardnerella): NEGATIVE
Candida Glabrata: NEGATIVE
Candida Vaginitis: POSITIVE — AB
Chlamydia: NEGATIVE
Comment: NEGATIVE
Comment: NEGATIVE
Comment: NEGATIVE
Comment: NEGATIVE
Comment: NEGATIVE
Comment: NORMAL
Neisseria Gonorrhea: NEGATIVE
Trichomonas: NEGATIVE

## 2022-06-11 LAB — URINE CULTURE
MICRO NUMBER:: 14199768
Result:: NO GROWTH
SPECIMEN QUALITY:: ADEQUATE

## 2022-06-11 MED ORDER — FLUCONAZOLE 150 MG PO TABS: 150.0000 mg | ORAL_TABLET | ORAL | 3 refills | Status: DC

## 2022-06-11 NOTE — Addendum Note (Signed)
Addended by: Milas Hock A on: 06/11/2022 02:06 PM   Modules accepted: Orders

## 2022-06-14 ENCOUNTER — Institutional Professional Consult (permissible substitution): Payer: 59 | Admitting: Licensed Clinical Social Worker

## 2022-06-14 MED ORDER — ONDANSETRON 4 MG PO TBDP: 4.0000 mg | ORAL_TABLET | Freq: Four times a day (QID) | ORAL | 0 refills | Status: AC | PRN

## 2022-07-01 ENCOUNTER — Institutional Professional Consult (permissible substitution): Payer: Self-pay | Admitting: Licensed Clinical Social Worker

## 2022-07-01 ENCOUNTER — Encounter: Payer: Managed Care, Other (non HMO) | Admitting: Advanced Practice Midwife

## 2023-03-14 ENCOUNTER — Other Ambulatory Visit: Payer: Self-pay | Admitting: Family Medicine

## 2023-03-14 DIAGNOSIS — N644 Mastodynia: Secondary | ICD-10-CM

## 2023-04-08 ENCOUNTER — Other Ambulatory Visit: Payer: Self-pay | Admitting: Family Medicine

## 2023-04-08 ENCOUNTER — Ambulatory Visit
Admission: RE | Admit: 2023-04-08 | Discharge: 2023-04-08 | Disposition: A | Discharge status: Home / Self Care | Payer: Managed Care, Other (non HMO) | Source: Ambulatory Visit | Attending: Family Medicine | Admitting: Family Medicine

## 2023-04-08 DIAGNOSIS — N644 Mastodynia: Secondary | ICD-10-CM

## 2023-04-08 DIAGNOSIS — N6310 Unspecified lump in the right breast, unspecified quadrant: Secondary | ICD-10-CM

## 2023-04-21 ENCOUNTER — Ambulatory Visit
Admission: RE | Admit: 2023-04-21 | Discharge: 2023-04-21 | Disposition: A | Discharge status: Home / Self Care | Payer: Managed Care, Other (non HMO) | Source: Ambulatory Visit | Attending: Family Medicine | Admitting: Family Medicine

## 2023-04-21 DIAGNOSIS — N644 Mastodynia: Secondary | ICD-10-CM

## 2023-04-21 DIAGNOSIS — N6310 Unspecified lump in the right breast, unspecified quadrant: Secondary | ICD-10-CM

## 2023-04-21 HISTORY — PX: BREAST BIOPSY: SHX20

## 2023-04-22 LAB — SURGICAL PATHOLOGY

## 2023-06-20 ENCOUNTER — Other Ambulatory Visit (HOSPITAL_COMMUNITY): Payer: Self-pay | Admitting: Obstetrics and Gynecology

## 2023-06-20 ENCOUNTER — Encounter (HOSPITAL_BASED_OUTPATIENT_CLINIC_OR_DEPARTMENT_OTHER): Payer: Self-pay | Admitting: Obstetrics and Gynecology

## 2023-06-20 DIAGNOSIS — O021 Missed abortion: Secondary | ICD-10-CM

## 2023-06-20 NOTE — Progress Notes (Signed)
Spoke w/ via phone for pre-op interview--- [facility] Lab needs dos----        NONE Lab results------ COVID test -----patient states asymptomatic no test needed Arrive at -------0800 NPO after MN NO Solid Food.  Clear liquids from MN until---0700 Med rec completed Medications to take morning of surgery -----NONE Diabetic medication ----- Patient instructed no nail polish to be worn day of surgery Patient instructed to bring photo id and insurance card day of surgery Patient aware to have Driver (ride ) / caregiver    for 24 hours after surgery - Fiance Nydia Bouton Patient Special Instructions ----- Pre-Op special Instructions ----- Patient verbalized understanding of instructions that were given at this phone interview. Patient denies chest pain, sob, fever, cough at the interview.

## 2023-06-20 NOTE — H&P (Signed)
[name] is an 42 y.o. [id number] who is admitted for Dilation and suction curettage under ultrasound guidance for missed abortion.  Patient was seen for routine OB visit at [redacted]w[redacted]d on 06/20/2023. Fetal heart tones were not heard. Ultrasound revealed absence of fetal cardiac activity. Fetus was [redacted]w[redacted]d by CRL. Patient desires surgical management and desires Kizzie Fantasia genetic testing.  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 Patient is pregnant.    Past Medical History:  Diagnosis Date   Anxiety    Asthma    Depression    Family history of adverse reaction to anesthesia    oldest son hyper with anesthesia at age 54   GERD (gastroesophageal reflux disease)    Headache    migraine   Panic attack    Pelvic pain    PTSD (post-traumatic stress disorder)     Past Surgical History:  Procedure Laterality Date   ADENOIDECTOMY     BREAST BIOPSY Right 04/21/2023   [place] RT BREAST BX W LOC DEV 1ST LESION IMG BX SPEC US GUIDE 04/21/2023 [facility]MO[facility]AP[facility]   CESAREAN SECTION  01/29/1998   DILATION AND CURETTAGE OF UTERUS     DILATION AND EVACUATION N/A 05/31/2022   Procedure: DILATATION AND EVACUATION;  Surgeon: Milas Hock, MD;  Location: [facility];  Service: Gynecology;  Laterality: N/A;   lap removal of adhesions  2008   removal of endometriosis   LAPAROSCOPY N/A 06/28/2019   Procedure: LAPAROSCOPY DIAGNOSTIC LYSIS OF ADHESIONS;  Surgeon: Richardean Chimera, MD;  Location: [facility];  Service: Gynecology;  Laterality: N/A;   lymph node disection     results benign   MYRINGOTOMY     reduction of turbinates     TONSILLECTOMY     WISDOM TOOTH EXTRACTION  02/2020    Family History  Problem Relation Age of Onset    Cancer Mother    Hepatitis C Mother    Hypertension Mother    Diabetes Mother    Uterine cancer Mother    Mitral valve prolapse Mother    Hepatitis C Father    Hypertension Father    Breast cancer Maternal Grandmother    Cancer - Other Maternal Grandmother    Diabetes Other    Hypertension Other    Arthritis Other     Social History:  reports that she quit smoking about 2 years ago. Her smoking use included cigarettes. She started smoking about 26 years ago. She has never used smokeless tobacco. She reports that she does not currently use alcohol. She reports that she does not use drugs.  ALLERGIES/MEDS:  Allergies:  Allergies  Allergen Reactions   Clindamycin Rash and Shortness Of Breath    Other reaction(s): Other (See Comments) Elevated BP   Clindamycin/Lincomycin Shortness Of Breath, Rash and Other (See Comments)    Elevated BP   Penicillins Anaphylaxis and Hives   Sulfa Antibiotics Rash   Clotrimazole     "cracks my skin open"   Latex Hives   Monistat [Miconazole Nitrate-Wipes] Other (See Comments)    'cracks my skin open'   Neosporin [Neomycin-Bacitracin Zn-Polymyx] Other (See Comments)    'cracks my skin open'   Oxybutynin     Other reaction(s): Cramps (ALLERGY/intolerance) Pt had severe abdominal  cramps   Betadine [Povidone Iodine] Hives and Rash   Povidone-Iodine Rash and Hives    No medications prior to admission.     Review of Systems  Constitutional: Negative.   HENT: Negative.    Eyes: Negative.   Respiratory: Negative.    Cardiovascular: Negative.   Gastrointestinal: Negative.   Genitourinary: Negative.   Musculoskeletal: Negative.   Skin: Negative.   Neurological: Negative.   Endo/Heme/Allergies: Negative.   Psychiatric/Behavioral: Negative.      Height 5' 4.75" (1.645 m), weight 97.1 kg. Gen:  NAD, pleasant and cooperative Cardio:  RRR Pulm:  CTAB, no wheezes/rales/rhonchi Abd:  Soft, non-distended, non-tender throughout, no  rebound/guarding Ext:  No bilateral LE edema, no bilateral calf tenderness  No results found for this or any previous visit (from the past 24 hour(s)).  No results found.   ASSESSMENT/PLAN: [name] is a 42 y.o. [id number] who is admitted for  Dilation and suction curettage under ultrasound guidance for missed abortion.  - Admit to [facility] - Admit labs (CBC, T&S) - Diet:  ERAS pathway/per anesthesia - IVF:  Per anesthesia - VTE Prophylaxis:  SCDs - Antibiotics: Doxycycline 200mg  IV on call to OR - D/C home same-day  Consents: I discussed with the patient that this surgery is performed to remove the products of conception via suction.  Prior to surgery, the risks and benefits of the surgery, as well as alternative treatments, have been discussed.  The risks include, but are not limited to bleeding, including the need for a blood transfusion, infection, damage to organs and tissues, including uterine perforation, requiring additional surgery, postoperative pain, short-term and long-term, deep vein thrombosis and/or pulmonary embolism, painful intercourse, complications the course of which cannot be predicted or prevented, and death.   Patient was consented for blood products.  The patient is aware that bleeding may result in the need for a blood transfusion which includes risk of transmission of HIV (1:2 million), Hepatitis C (1:2 million), and Hepatitis B (1:200 thousand) and transfusion reaction.  Patient voiced understanding of the above risks as well as understanding of indications for blood transfusion.     Steva Ready, DO

## 2023-06-21 NOTE — Anesthesia Preprocedure Evaluation (Signed)
Anesthesia Evaluation  Patient identified by MRN, date of birth, ID band Patient awake    Reviewed: Allergy & Precautions, NPO status , Patient's Chart, lab work & pertinent test results  Airway Mallampati: I  TM Distance: >3 FB Neck ROM: Full    Dental no notable dental hx. (+) Teeth Intact, Dental Advisory Given   Pulmonary asthma , former smoker   Pulmonary exam normal breath sounds clear to auscultation       Cardiovascular negative cardio ROS Normal cardiovascular exam Rhythm:Regular Rate:Normal     Neuro/Psych  Headaches PSYCHIATRIC DISORDERS Anxiety Depression       GI/Hepatic ,GERD  ,,  Endo/Other  negative endocrine ROS    Renal/GU      Musculoskeletal negative musculoskeletal ROS (+)    Abdominal   Peds  Hematology Lab Results      Component                Value               Date                      WBC                      5.5                 06/22/2023                HGB                      12.5                06/22/2023                HCT                      38.0                06/22/2023                MCV                      93.8                06/22/2023                PLT                      244                 06/22/2023              Anesthesia Other Findings   Reproductive/Obstetrics                             Anesthesia Physical Anesthesia Plan  ASA: 2  Anesthesia Plan: General   Post-op Pain Management: Tylenol PO (pre-op)* and Precedex   Induction: Intravenous  PONV Risk Score and Plan: 3 and Treatment may vary due to age or medical condition, Midazolam, Dexamethasone and Ondansetron  Airway Management Planned: LMA  Additional Equipment: Spinal Drain  Intra-op Plan:   Post-operative Plan:   Informed Consent: I have reviewed the patients History and Physical, chart, labs and discussed the procedure including the risks, benefits and alternatives  for the proposed anesthesia with the patient or  authorized representative who has indicated his/her understanding and acceptance.     Dental advisory given  Plan Discussed with: CRNA  Anesthesia Plan Comments:        Anesthesia Quick Evaluation

## 2023-06-22 ENCOUNTER — Ambulatory Visit (HOSPITAL_BASED_OUTPATIENT_CLINIC_OR_DEPARTMENT_OTHER): Payer: Self-pay | Admitting: Anesthesiology

## 2023-06-22 ENCOUNTER — Other Ambulatory Visit: Payer: Self-pay

## 2023-06-22 ENCOUNTER — Ambulatory Visit (HOSPITAL_BASED_OUTPATIENT_CLINIC_OR_DEPARTMENT_OTHER)
Admission: RE | Admit: 2023-06-22 | Discharge: 2023-06-22 | Disposition: A | Discharge status: Home / Self Care | Payer: Managed Care, Other (non HMO) | Source: Ambulatory Visit | Attending: Obstetrics and Gynecology | Admitting: Obstetrics and Gynecology

## 2023-06-22 ENCOUNTER — Encounter (HOSPITAL_BASED_OUTPATIENT_CLINIC_OR_DEPARTMENT_OTHER): Payer: Self-pay | Admitting: Obstetrics and Gynecology

## 2023-06-22 ENCOUNTER — Ambulatory Visit (HOSPITAL_COMMUNITY)
Admission: RE | Admit: 2023-06-22 | Discharge: 2023-06-22 | Discharge status: Home / Self Care | Payer: Managed Care, Other (non HMO) | Source: Ambulatory Visit | Attending: Obstetrics and Gynecology

## 2023-06-22 ENCOUNTER — Encounter (HOSPITAL_BASED_OUTPATIENT_CLINIC_OR_DEPARTMENT_OTHER)
Admission: RE | Disposition: A | Discharge status: Home / Self Care | Payer: Self-pay | Source: Ambulatory Visit | Attending: Obstetrics and Gynecology

## 2023-06-22 DIAGNOSIS — O021 Missed abortion: Secondary | ICD-10-CM | POA: Diagnosis not present

## 2023-06-22 DIAGNOSIS — Z3A14 14 weeks gestation of pregnancy: Secondary | ICD-10-CM | POA: Diagnosis not present

## 2023-06-22 HISTORY — PX: DILATION AND EVACUATION: SHX1459

## 2023-06-22 HISTORY — PX: OPERATIVE ULTRASOUND: SHX5996

## 2023-06-22 LAB — CBC
HCT: 38 % (ref 36.0–46.0)
Hemoglobin: 12.5 g/dL (ref 12.0–15.0)
MCH: 30.9 pg (ref 26.0–34.0)
MCHC: 32.9 g/dL (ref 30.0–36.0)
MCV: 93.8 fL (ref 80.0–100.0)
Platelets: 244 10*3/uL (ref 150–400)
RBC: 4.05 MIL/uL (ref 3.87–5.11)
RDW: 13.3 % (ref 11.5–15.5)
WBC: 5.5 10*3/uL (ref 4.0–10.5)
nRBC: 0 % (ref 0.0–0.2)

## 2023-06-22 LAB — TYPE AND SCREEN
ABO/RH(D): O POS
Antibody Screen: NEGATIVE

## 2023-06-22 SURGERY — DILATION AND EVACUATION, UTERUS
Anesthesia: General | Site: Uterus

## 2023-06-22 MED ORDER — PROPOFOL 10 MG/ML IV BOLUS
INTRAVENOUS | Status: AC
  Filled 2023-06-22: qty 20

## 2023-06-22 MED ORDER — DOXYCYCLINE HYCLATE 100 MG IV SOLR
200.0000 mg | INTRAVENOUS | Status: AC
  Administered 2023-06-22: 200 mg via INTRAVENOUS
  Filled 2023-06-22: qty 200

## 2023-06-22 MED ORDER — MIDAZOLAM HCL 2 MG/2ML IJ SOLN
INTRAMUSCULAR | Status: AC
  Filled 2023-06-22: qty 2

## 2023-06-22 MED ORDER — DEXAMETHASONE SODIUM PHOSPHATE 10 MG/ML IJ SOLN
INTRAMUSCULAR | Status: DC | PRN
  Administered 2023-06-22: 10 mg via INTRAVENOUS

## 2023-06-22 MED ORDER — PROPOFOL 10 MG/ML IV BOLUS
INTRAVENOUS | Status: DC | PRN
  Administered 2023-06-22: 180 mg via INTRAVENOUS

## 2023-06-22 MED ORDER — MIDAZOLAM HCL 5 MG/5ML IJ SOLN
INTRAMUSCULAR | Status: DC | PRN
  Administered 2023-06-22: 2 mg via INTRAVENOUS

## 2023-06-22 MED ORDER — IBUPROFEN 800 MG PO TABS: 800.0000 mg | ORAL_TABLET | Freq: Three times a day (TID) | ORAL | 1 refills | Status: AC | PRN

## 2023-06-22 MED ORDER — SODIUM CHLORIDE 0.9 % IV SOLN
INTRAVENOUS | Status: DC
  Administered 2023-06-22: 500 mL via INTRAVENOUS

## 2023-06-22 MED ORDER — KETOROLAC TROMETHAMINE 30 MG/ML IJ SOLN: 30.0000 mg | Freq: Once | INTRAMUSCULAR | Status: DC | PRN

## 2023-06-22 MED ORDER — DEXMEDETOMIDINE HCL IN NACL 80 MCG/20ML IV SOLN
INTRAVENOUS | Status: DC | PRN
  Administered 2023-06-22: 8 ug via INTRAVENOUS

## 2023-06-22 MED ORDER — SILVER NITRATE-POT NITRATE 75-25 % EX MISC
CUTANEOUS | Status: DC | PRN
  Administered 2023-06-22: 2

## 2023-06-22 MED ORDER — FLUCONAZOLE 150 MG PO TABS: 150.0000 mg | ORAL_TABLET | ORAL | 0 refills | Status: AC

## 2023-06-22 MED ORDER — OXYCODONE HCL 5 MG PO TABS: 5.0000 mg | ORAL_TABLET | Freq: Once | ORAL | Status: DC | PRN

## 2023-06-22 MED ORDER — 0.9 % SODIUM CHLORIDE (POUR BTL) OPTIME
TOPICAL | Status: DC | PRN
  Administered 2023-06-22: 500 mL

## 2023-06-22 MED ORDER — HYDROMORPHONE HCL 1 MG/ML IJ SOLN
0.2500 mg | INTRAMUSCULAR | Status: DC | PRN
  Administered 2023-06-22: 0.5 mg via INTRAVENOUS

## 2023-06-22 MED ORDER — LIDOCAINE 2% (20 MG/ML) 5 ML SYRINGE
INTRAMUSCULAR | Status: DC | PRN
  Administered 2023-06-22: 60 mg via INTRAVENOUS

## 2023-06-22 MED ORDER — ONDANSETRON HCL 4 MG/2ML IJ SOLN: 4.0000 mg | Freq: Once | INTRAMUSCULAR | Status: DC | PRN

## 2023-06-22 MED ORDER — OXYCODONE HCL 5 MG/5ML PO SOLN: 5.0000 mg | Freq: Once | ORAL | Status: DC | PRN

## 2023-06-22 MED ORDER — STERILE WATER FOR IRRIGATION IR SOLN
Status: DC | PRN
  Administered 2023-06-22: 500 mL

## 2023-06-22 MED ORDER — TRANEXAMIC ACID-NACL 1000-0.7 MG/100ML-% IV SOLN
INTRAVENOUS | Status: AC
  Filled 2023-06-22: qty 100

## 2023-06-22 MED ORDER — HYDROMORPHONE HCL 1 MG/ML IJ SOLN
INTRAMUSCULAR | Status: AC
  Filled 2023-06-22: qty 1

## 2023-06-22 MED ORDER — FENTANYL CITRATE (PF) 100 MCG/2ML IJ SOLN
INTRAMUSCULAR | Status: AC
  Filled 2023-06-22: qty 2

## 2023-06-22 MED ORDER — KETOROLAC TROMETHAMINE 30 MG/ML IJ SOLN
INTRAMUSCULAR | Status: DC | PRN
  Administered 2023-06-22: 30 mg via INTRAVENOUS

## 2023-06-22 MED ORDER — MISOPROSTOL 200 MCG PO TABS
ORAL_TABLET | ORAL | Status: DC | PRN
  Administered 2023-06-22: 1000 ug via RECTAL

## 2023-06-22 MED ORDER — ACETAMINOPHEN 500 MG PO TABS
1000.0000 mg | ORAL_TABLET | Freq: Once | ORAL | Status: AC
  Administered 2023-06-22: 1000 mg via ORAL

## 2023-06-22 MED ORDER — FENTANYL CITRATE (PF) 100 MCG/2ML IJ SOLN
INTRAMUSCULAR | Status: DC | PRN
  Administered 2023-06-22 (×2): 50 ug via INTRAVENOUS

## 2023-06-22 MED ORDER — ACETAMINOPHEN 500 MG PO TABS
ORAL_TABLET | ORAL | Status: AC
  Filled 2023-06-22: qty 2

## 2023-06-22 MED ORDER — ONDANSETRON HCL 4 MG/2ML IJ SOLN
INTRAMUSCULAR | Status: DC | PRN
  Administered 2023-06-22: 4 mg via INTRAVENOUS

## 2023-06-22 SURGICAL SUPPLY — 25 items
CATH ROBINSON RED A/P 16FR (CATHETERS) ×2 IMPLANT
CATH SILICONE 14FRX5CC (CATHETERS) IMPLANT
DRSG TELFA 3X8 NADH STRL (GAUZE/BANDAGES/DRESSINGS) ×2 IMPLANT
FILTER UTR ASPR ASSEMBLY (MISCELLANEOUS) ×2 IMPLANT
GAUZE 4X4 16PLY ~~LOC~~+RFID DBL (SPONGE) ×2 IMPLANT
GLOVE BIO SURGEON STRL SZ 6.5 (GLOVE) ×4 IMPLANT
GLOVE BIOGEL PI IND STRL 6.5 (GLOVE) ×2 IMPLANT
GOWN STRL REUS W/TWL LRG LVL3 (GOWN DISPOSABLE) ×4 IMPLANT
HOSE CONNECTING 18IN BERKELEY (TUBING) ×2 IMPLANT
KIT BERKELEY 1ST TRIMESTER 3/8 (MISCELLANEOUS) ×6 IMPLANT
KIT TURNOVER CYSTO (KITS) ×2 IMPLANT
NS IRRIG 1000ML POUR BTL (IV SOLUTION) ×2 IMPLANT
NS IRRIG 500ML POUR BTL (IV SOLUTION) IMPLANT
PACK VAGINAL MINOR WOMEN LF (CUSTOM PROCEDURE TRAY) ×2 IMPLANT
PAD OB MATERNITY 4.3X12.25 (PERSONAL CARE ITEMS) ×2 IMPLANT
SET BERKELEY SUCTION TUBING (SUCTIONS) ×2 IMPLANT
SLEEVE SCD COMPRESS KNEE MED (STOCKING) ×2 IMPLANT
SPIKE FLUID TRANSFER (MISCELLANEOUS) ×2 IMPLANT
UNDERPAD 30X36 HEAVY ABSORB (UNDERPADS AND DIAPERS) ×2 IMPLANT
VACURETTE 10 RIGID CVD (CANNULA) IMPLANT
VACURETTE 12 RIGID CVD (CANNULA) IMPLANT
VACURETTE 7MM CVD STRL WRAP (CANNULA) IMPLANT
VACURETTE 8 RIGID CVD (CANNULA) IMPLANT
VACURETTE 9 RIGID CVD (CANNULA) IMPLANT
WATER STERILE IRR 500ML POUR (IV SOLUTION) IMPLANT

## 2023-06-22 NOTE — Transfer of Care (Signed)
Immediate Anesthesia Transfer of Care Note  Patient: [name]  Procedure(s) Performed: DILATATION AND EVACUATION (Uterus) OPERATIVE ULTRASOUND (Abdomen)  Patient Location: PACU  Anesthesia Type:General  Level of Consciousness: awake, alert , oriented, and patient cooperative  Airway & Oxygen Therapy: Patient Spontanous Breathing and Patient connected to nasal cannula oxygen  Post-op Assessment: Report given to RN and Post -op Vital signs reviewed and stable  Post vital signs: Reviewed and stable  Last Vitals:  Vitals Value Taken Time  BP 106/66 06/22/23 1030  Temp    Pulse 81 06/22/23 1030  Resp 11 06/22/23 1030  SpO2 100 % 06/22/23 1030  Vitals shown include unfiled device data.  Last Pain:  Vitals:   06/22/23 0833  PainSc: 7       Patients Stated Pain Goal: 8 (06/22/23 0865)  Complications: No notable events documented.

## 2023-06-22 NOTE — Anesthesia Postprocedure Evaluation (Signed)
Anesthesia Post Note  Patient: [name]  Procedure(s) Performed: DILATATION AND EVACUATION (Uterus) OPERATIVE ULTRASOUND (Abdomen)     Patient location during evaluation: PACU Anesthesia Type: General Level of consciousness: awake and alert Pain management: pain level controlled Vital Signs Assessment: post-procedure vital signs reviewed and stable Respiratory status: spontaneous breathing, nonlabored ventilation, respiratory function stable and patient connected to nasal cannula oxygen Cardiovascular status: blood pressure returned to baseline and stable Postop Assessment: no apparent nausea or vomiting Anesthetic complications: no   No notable events documented.  Last Vitals:  Vitals:   06/22/23 1100 06/22/23 1154  BP: 114/74 128/76  Pulse: 80 68  Resp: 10 18  Temp:  36.7 C  SpO2: 100% 100%    Last Pain:  Vitals:   06/22/23 1154  TempSrc: Oral  PainSc: 4                  Trevor Iha

## 2023-06-22 NOTE — Op Note (Signed)
Pre Op Dx:   Missed abortion  Post Op Dx:   Same as pre-operative diagnoses  Procedure:   Dilation and suction curettage under ultrasound guidance   Surgeon:  Dr. Steva Ready Assistants:  None Anesthesia:  LMA   EBL:  200cc  IVF:  400cc UOP:  75cc via in-and-out catheter at end of procedure   Drains:  Foley catheter Specimen removed:  Products of conception - sent to pathology  Portion of products of conception sent for [facility] genetics Device(s) implanted: None Case Type:  Clean-contaminated Findings:  Normal-appearing cervix. Fetus on ultrasound without fetal heart rate. Clean uterine stripe with minimal blood in uterine cavity after procedure. Complications: None Indications:  43 y.o. [id number] with missed abortion who desires surgical management.  Description of each procedure:  After informed consent was obtained the patient was taken to the operating room in the dorsal supine position.  After administration of general anesthesia, the patient was placed in the dorsal lithotomy position and prepped and draped in the usual sterile fashion. A pre-operative time-out was completed.  The anterior lip of the cervix was grasped with a single-tooth tenaculum and the cervix was serially dilated under ultrasound guidance to accommodate an 12mm suction cannula. The cannula was gently advanced to the fundus, then withdrawn under suction in a spiral fashion with return of products of conception. Multiple passes were required. Hemostasis adequate. Complete uterine evacuation confirmed with thin uterine stripe noted on ultrasound with minimal blood in uterine cavity. The single-tooth tenaculum was removed and its sites were made hemostatic using silver nitrate and pressure. The bladder was drained with an in-and-out catheter. Adequate hemostasis was noted. Cytotec per rectum was administered as prophylaxis. The patient was awakened and extubated and appeared to have tolerated the procedure  well.  All counts were correct.  Disposition:  PACU  Steva Ready, DO

## 2023-06-22 NOTE — Discharge Instructions (Signed)
  Post Anesthesia Home Care Instructions  Activity: Get plenty of rest for the remainder of the day. A responsible individual must stay with you for 24 hours following the procedure.  For the next 24 hours, DO NOT: -Drive a car -Advertising copywriter -Drink alcoholic beverages -Take any medication unless instructed by your physician -Make any legal decisions or sign important papers.  Meals: Start with liquid foods such as gelatin or soup. Progress to regular foods as tolerated. Avoid greasy, spicy, heavy foods. If nausea and/or vomiting occur, drink only clear liquids until the nausea and/or vomiting subsides. Call your physician if vomiting continues.  Special Instructions/Symptoms: Your throat may feel dry or sore from the anesthesia or the breathing tube placed in your throat during surgery. If this causes discomfort, gargle with warm salt water. The discomfort should disappear within 24 hours.    [facility] Home care Instructions:   Personal hygiene:  Used sanitary napkins for vaginal drainage not tampons. Shower or tub bathe the day after your procedure. No douching until bleeding stops. Always wipe from front to back after  Elimination.  Activity: Do not drive or operate any equipment today. The effects of the anesthesia are still present and drowsiness may result. Rest today, not necessarily flat bed rest, just take it easy. You may resume your normal activity in one to 2 days.  Sexual activity: No intercourse for one week or as indicated by your physician  Diet: Eat a light diet as desired this evening. You may resume a regular diet tomorrow.  Return to work: One to 2 days.  General Expectations of your surgery: Vaginal bleeding should be no heavier than a normal period. Spotting may continue up to 10 days. Mild cramps may continue for a couple of days. You may have a regular period in 2-6 weeks.  Unexpected observations call your doctor if these occur: persistent or heavy bleeding.  Severe abdominal cramping or pain. Elevation of temperature greater than 100F.

## 2023-06-22 NOTE — Interval H&P Note (Signed)
History and Physical Interval Note:  06/22/2023 9:07 AM  [name]  has presented today for surgery, with the diagnosis of Missed Abortion.  The various methods of treatment have been discussed with the patient and family. After consideration of risks, benefits and other options for treatment, the patient has consented to  Procedure(s): DILATATION AND EVACUATION (N/A) OPERATIVE ULTRASOUND (N/A) as a surgical intervention. She desires ANORA genetic testing. The patient's history has been reviewed, patient examined, no change in status, stable for surgery.  I have reviewed the patient's chart and labs.  Questions were answered to the patient's satisfaction.     Steva Ready

## 2023-06-22 NOTE — Anesthesia Procedure Notes (Signed)
Procedure Name: LMA Insertion Date/Time: 06/22/2023 9:45 AM  Performed by: Bishop Limbo, CRNAPre-anesthesia Checklist: Patient identified, Emergency Drugs available, Suction available and Patient being monitored Patient Re-evaluated:Patient Re-evaluated prior to induction Oxygen Delivery Method: Circle System Utilized Preoxygenation: Pre-oxygenation with 100% oxygen Induction Type: IV induction Ventilation: Mask ventilation without difficulty LMA: LMA inserted LMA Size: 4.0 Number of attempts: 1 Placement Confirmation: positive ETCO2 Tube secured with: Tape Dental Injury: Teeth and Oropharynx as per pre-operative assessment

## 2023-06-23 ENCOUNTER — Inpatient Hospital Stay (HOSPITAL_COMMUNITY)
Admission: AD | Admit: 2023-06-23 | Discharge: 2023-06-23 | Disposition: A | Discharge status: Home / Self Care | Payer: Managed Care, Other (non HMO) | Attending: Obstetrics and Gynecology | Admitting: Obstetrics and Gynecology

## 2023-06-23 ENCOUNTER — Encounter (HOSPITAL_COMMUNITY): Payer: Self-pay | Admitting: *Deleted

## 2023-06-23 DIAGNOSIS — Y849 Medical procedure, unspecified as the cause of abnormal reaction of the patient, or of later complication, without mention of misadventure at the time of the procedure: Secondary | ICD-10-CM | POA: Diagnosis not present

## 2023-06-23 DIAGNOSIS — O080 Genital tract and pelvic infection following ectopic and molar pregnancy: Secondary | ICD-10-CM | POA: Diagnosis not present

## 2023-06-23 DIAGNOSIS — T8140XA Infection following a procedure, unspecified, initial encounter: Secondary | ICD-10-CM | POA: Diagnosis not present

## 2023-06-23 DIAGNOSIS — N719 Inflammatory disease of uterus, unspecified: Secondary | ICD-10-CM | POA: Diagnosis not present

## 2023-06-23 LAB — URINALYSIS, ROUTINE W REFLEX MICROSCOPIC
Bacteria, UA: NONE SEEN
Bilirubin Urine: NEGATIVE
Glucose, UA: NEGATIVE mg/dL
Ketones, ur: NEGATIVE mg/dL
Leukocytes,Ua: NEGATIVE
Nitrite: NEGATIVE
Protein, ur: NEGATIVE mg/dL
Specific Gravity, Urine: 1.005 (ref 1.005–1.030)
pH: 6 (ref 5.0–8.0)

## 2023-06-23 LAB — CBC WITH DIFFERENTIAL/PLATELET
Abs Immature Granulocytes: 0.06 10*3/uL (ref 0.00–0.07)
Basophils Absolute: 0 10*3/uL (ref 0.0–0.1)
Basophils Relative: 0 %
Eosinophils Absolute: 0 10*3/uL (ref 0.0–0.5)
Eosinophils Relative: 0 %
HCT: 33 % — ABNORMAL LOW (ref 36.0–46.0)
Hemoglobin: 10.9 g/dL — ABNORMAL LOW (ref 12.0–15.0)
Immature Granulocytes: 1 %
Lymphocytes Relative: 18 %
Lymphs Abs: 2.4 10*3/uL (ref 0.7–4.0)
MCH: 30 pg (ref 26.0–34.0)
MCHC: 33 g/dL (ref 30.0–36.0)
MCV: 90.9 fL (ref 80.0–100.0)
Monocytes Absolute: 0.9 10*3/uL (ref 0.1–1.0)
Monocytes Relative: 7 %
Neutro Abs: 9.9 10*3/uL — ABNORMAL HIGH (ref 1.7–7.7)
Neutrophils Relative %: 74 %
Platelets: 226 10*3/uL (ref 150–400)
RBC: 3.63 MIL/uL — ABNORMAL LOW (ref 3.87–5.11)
RDW: 13.2 % (ref 11.5–15.5)
WBC: 13.3 10*3/uL — ABNORMAL HIGH (ref 4.0–10.5)
nRBC: 0 % (ref 0.0–0.2)

## 2023-06-23 LAB — RESP PANEL BY RT-PCR (RSV, FLU A&B, COVID)  RVPGX2
Influenza A by PCR: NEGATIVE
Influenza B by PCR: NEGATIVE
Resp Syncytial Virus by PCR: NEGATIVE
SARS Coronavirus 2 by RT PCR: NEGATIVE

## 2023-06-23 LAB — LACTIC ACID, PLASMA: Lactic Acid, Venous: 0.8 mmol/L (ref 0.5–1.9)

## 2023-06-23 MED ORDER — DOXYCYCLINE HYCLATE 100 MG PO CAPS: 100.0000 mg | ORAL_CAPSULE | Freq: Two times a day (BID) | ORAL | 0 refills | Status: AC

## 2023-06-23 MED ORDER — KETOROLAC TROMETHAMINE 60 MG/2ML IM SOLN
60.0000 mg | Freq: Once | INTRAMUSCULAR | Status: AC
  Administered 2023-06-23: 60 mg via INTRAMUSCULAR
  Filled 2023-06-23: qty 2

## 2023-06-23 MED ORDER — FLUCONAZOLE 150 MG PO TABS: ORAL_TABLET | ORAL | 0 refills | Status: AC

## 2023-06-23 NOTE — MAU Note (Signed)
Pt is s/p D&E yesterday for 14 week loss.  Today c/o chills and fever of 102 at home, and slight cramping in the back.  Denies abdominal tenderness.

## 2023-06-23 NOTE — MAU Provider Note (Signed)
Chief Complaint: Fever, Back Pain, Fatigue, and Dizziness   Event Date/Time   First Provider Initiated Contact with Patient 06/23/23 1256      SUBJECTIVE HPI: [name] is a 42 y.o. [id number] who is s/p D&E on 06/22/23 for missed abortion with onset of fever/chills today.  She reports she has been taking ibuprofen and Tylenol as scheduled around the clock since the procedure. This morning, she went to the grocery store and while standing in the store felt chills and came home and took her temperature. Temp was 102.7 at home.  Her vaginal bleeding was heavy yesterday with cramping pain, especially in her back, but her pain and bleeding are improved today.  Last dose of Tylenol was at 0800 and last dose of Motrin was 1100.  HPI  Past Medical History:  Diagnosis Date   Anxiety    Asthma    Depression    Family history of adverse reaction to anesthesia    oldest son hyper with anesthesia at age 2   GERD (gastroesophageal reflux disease)    Headache    migraine   Panic attack    Pelvic pain    PTSD (post-traumatic stress disorder)    Past Surgical History:  Procedure Laterality Date   ADENOIDECTOMY     BREAST BIOPSY Right 04/21/2023   [place] RT BREAST BX W LOC DEV 1ST LESION IMG BX SPEC US GUIDE 04/21/2023 [facility]   CESAREAN SECTION  01/29/1998   DILATION AND CURETTAGE OF UTERUS     DILATION AND EVACUATION N/A 05/31/2022   Procedure: DILATATION AND EVACUATION;  Surgeon: Milas Hock, MD;  Location: [facility] OR;  Service: Gynecology;  Laterality: N/A;   lap removal of adhesions  2008   removal of endometriosis   LAPAROSCOPY N/A 06/28/2019   Procedure: LAPAROSCOPY DIAGNOSTIC LYSIS OF ADHESIONS;  Surgeon: Richardean Chimera, MD;  Location: [facility];  Service: Gynecology;  Laterality: N/A;   lymph node disection     results benign   MYRINGOTOMY     reduction of turbinates     TONSILLECTOMY     WISDOM TOOTH EXTRACTION  02/2020   Social History   Socioeconomic  History   Marital status: Single    Spouse name: Not on file   Number of children: Not on file   Years of education: Not on file   Highest education level: Not on file  Occupational History   Not on file  Tobacco Use   Smoking status: Former    Current packs/day: 0.00    Types: Cigarettes    Start date: 03/22/1997    Quit date: 03/22/2021    Years since quitting: 2.2   Smokeless tobacco: Never   Tobacco comments:    quit 04-22-1999 social   Vaping Use   Vaping status: Never Used  Substance and Sexual Activity   Alcohol use: Not Currently    Comment: occ, not since confirmed pregnancy   Drug use: No   Sexual activity: Yes    Partners: Male    Birth control/protection: None  Other Topics Concern   Not on file  Social History Narrative   Not on file   Social Determinants of Health   Financial Resource Strain: Not on file  Food Insecurity: Not on file  Transportation Needs: Not on file  Physical Activity: Not on file  Stress: Not on file  Social Connections: Not on file  Intimate Partner Violence: Not on file   No current facility-administered medications on file  prior to encounter.   Current Outpatient Medications on File Prior to Encounter  Medication Sig Dispense Refill   acetaminophen (TYLENOL) 325 MG tablet Take 2 tablets (650 mg total) by mouth every 6 (six) hours as needed. 60 tablet 0   albuterol (PROVENTIL HFA;VENTOLIN HFA) 108 (90 BASE) MCG/ACT inhaler Inhale 1 puff into the lungs every 6 (six) hours as needed for wheezing or shortness of breath.     budesonide-formoterol (SYMBICORT) 80-4.5 MCG/ACT inhaler Inhale 1 puff into the lungs 2 (two) times daily.     cetirizine (ZYRTEC) 10 MG tablet Take 10 mg by mouth daily as needed for allergies.     cyclobenzaprine (FLEXERIL) 10 MG tablet Take 1 tablet (10 mg total) by mouth 3 (three) times daily as needed for muscle spasms. 30 tablet 0   fluconazole (DIFLUCAN) 150 MG tablet Take 1 tablet (150 mg total) by mouth  every 3 (three) days for 2 doses. 2 tablet 0   fluticasone (FLONASE) 50 MCG/ACT nasal spray Place 2 sprays into both nostrils daily as needed for allergies.     ibuprofen (ADVIL) 800 MG tablet Take 1 tablet (800 mg total) by mouth every 8 (eight) hours as needed for headache, moderate pain (pain score 4-6) or cramping. 30 tablet 1   montelukast (SINGULAIR) 10 MG tablet Take 10 mg by mouth at bedtime.     ondansetron (ZOFRAN-ODT) 4 MG disintegrating tablet Take 1 tablet (4 mg total) by mouth every 6 (six) hours as needed for nausea. 30 tablet 0   Prenat-Fe Poly-Methfol-FA-DHA (VITAFOL ULTRA) 29-0.6-0.4-200 MG CAPS Take 1 tablet by mouth daily. 30 capsule 12   Allergies  Allergen Reactions   Clindamycin Rash and Shortness Of Breath    Other reaction(s): Other (See Comments) Elevated BP   Clindamycin/Lincomycin Shortness Of Breath, Rash and Other (See Comments)    Elevated BP   Penicillins Anaphylaxis and Hives   Sulfa Antibiotics Rash   Clotrimazole     "cracks my skin open"   Latex Hives   Monistat [Miconazole Nitrate-Wipes] Other (See Comments)    'cracks my skin open'   Neosporin [Neomycin-Bacitracin Zn-Polymyx] Other (See Comments)    'cracks my skin open'   Oxybutynin     Other reaction(s): Cramps (ALLERGY/intolerance) Pt had severe abdominal cramps   Betadine [Povidone Iodine] Hives and Rash   Povidone-Iodine Rash and Hives    ROS:  Review of Systems  Constitutional:  Positive for chills and fever. Negative for fatigue.  Respiratory:  Negative for shortness of breath.   Cardiovascular:  Negative for chest pain.  Gastrointestinal:  Positive for abdominal pain.  Genitourinary:  Positive for vaginal bleeding. Negative for difficulty urinating, dysuria, flank pain, pelvic pain, vaginal discharge and vaginal pain.  Neurological:  Negative for dizziness and headaches.  Psychiatric/Behavioral: Negative.       I have reviewed patient's Past Medical Hx, Surgical Hx, Family Hx,  Social Hx, medications and allergies.   Physical Exam  Patient Vitals for the past 24 hrs:  BP Temp Temp src Pulse Resp SpO2  06/23/23 1627 126/79 -- -- -- -- --  06/23/23 1229 -- 99.3 F (37.4 C) Oral -- 20 --  06/23/23 1226 116/69 -- -- 82 -- --  06/23/23 1211 122/80 98.7 F (37.1 C) Oral 68 18 100 %   Constitutional: Well-developed, well-nourished female in mild distress.  Cardiovascular: normal rate Respiratory: normal effort GI: Abd soft, non-tender. Pos BS x 4 MS: Extremities nontender, no edema, normal ROM Neurologic: Alert and oriented  x 4.  GU: Neg CVAT.  PELVIC EXAM: Deferred   LAB RESULTS Results for orders placed or performed during the hospital encounter of 06/23/23 (from the past 24 hour(s))  CBC with Differential/Platelet     Status: Abnormal   Collection Time: 06/23/23  1:05 PM  Result Value Ref Range   WBC 13.3 (H) 4.0 - 10.5 K/uL   RBC 3.63 (L) 3.87 - 5.11 MIL/uL   Hemoglobin 10.9 (L) 12.0 - 15.0 g/dL   HCT 96.2 (L) 95.2 - 84.1 %   MCV 90.9 80.0 - 100.0 fL   MCH 30.0 26.0 - 34.0 pg   MCHC 33.0 30.0 - 36.0 g/dL   RDW 32.4 40.1 - 02.7 %   Platelets 226 150 - 400 K/uL   nRBC 0.0 0.0 - 0.2 %   Neutrophils Relative % 74 %   Neutro Abs 9.9 (H) 1.7 - 7.7 K/uL   Lymphocytes Relative 18 %   Lymphs Abs 2.4 0.7 - 4.0 K/uL   Monocytes Relative 7 %   Monocytes Absolute 0.9 0.1 - 1.0 K/uL   Eosinophils Relative 0 %   Eosinophils Absolute 0.0 0.0 - 0.5 K/uL   Basophils Relative 0 %   Basophils Absolute 0.0 0.0 - 0.1 K/uL   Immature Granulocytes 1 %   Abs Immature Granulocytes 0.06 0.00 - 0.07 K/uL  Lactic acid, plasma     Status: None   Collection Time: 06/23/23  1:13 PM  Result Value Ref Range   Lactic Acid, Venous 0.8 0.5 - 1.9 mmol/L  Urinalysis, Routine w reflex microscopic -Urine, Clean Catch     Status: Abnormal   Collection Time: 06/23/23  1:19 PM  Result Value Ref Range   Color, Urine STRAW (A) YELLOW   APPearance CLEAR CLEAR   Specific Gravity,  Urine 1.005 1.005 - 1.030   pH 6.0 5.0 - 8.0   Glucose, UA NEGATIVE NEGATIVE mg/dL   Hgb urine dipstick MODERATE (A) NEGATIVE   Bilirubin Urine NEGATIVE NEGATIVE   Ketones, ur NEGATIVE NEGATIVE mg/dL   Protein, ur NEGATIVE NEGATIVE mg/dL   Nitrite NEGATIVE NEGATIVE   Leukocytes,Ua NEGATIVE NEGATIVE   RBC / HPF 0-5 0 - 5 RBC/hpf   WBC, UA 0-5 0 - 5 WBC/hpf   Bacteria, UA NONE SEEN NONE SEEN   Squamous Epithelial / HPF 0-5 0 - 5 /HPF  Resp panel by RT-PCR (RSV, Flu A&B, Covid) Anterior Nasal Swab     Status: None   Collection Time: 06/23/23  1:23 PM   Specimen: Anterior Nasal Swab  Result Value Ref Range   SARS Coronavirus 2 by RT PCR NEGATIVE NEGATIVE   Influenza A by PCR NEGATIVE NEGATIVE   Influenza B by PCR NEGATIVE NEGATIVE   Resp Syncytial Virus by PCR NEGATIVE NEGATIVE    --/--/O POS (11/27 0850)  IMAGING Not indicated  MAU Management/MDM: Orders Placed This Encounter  Procedures   Resp panel by RT-PCR (RSV, Flu A&B, Covid) Anterior Nasal Swab   CBC with Differential/Platelet   Lactic acid, plasma   Urinalysis, Routine w reflex microscopic -Urine, Clean Catch   Airborne and Contact precautions   Discharge patient    Meds ordered this encounter  Medications   ketorolac (TORADOL) injection 60 mg   doxycycline (VIBRAMYCIN) 100 MG capsule    Sig: Take 1 capsule (100 mg total) by mouth 2 (two) times daily for 14 days.    Dispense:  28 capsule    Refill:  0    Order Specific Question:  Supervising Provider    Answer:   Reva Bores [[id number]]   fluconazole (DIFLUCAN) 150 MG tablet    Sig: If you develop symptoms, take one tablet now, and one after antibiotics course is completed.    Dispense:  2 tablet    Refill:  0    Order Specific Question:   Supervising Provider    Answer:   Reva Bores [[id number]]    Pt afebrile in MAU, but recent doses of Tylenol/Motrin were taken. On exam, pt with minimal abdominal tenderness and bleeding scant and brown.  No urinary symptoms,  no respiratory symptom.  UA and respiratory panel collected, given fever, but were negative.  WBCs 13.3, an increase from lab values preop yesterday, but not inconsistent with recent surgery/pregnancy.  Reviewed with Dr Normand Sloop, who called to discuss the patient.  Pt did have increase in pain in MAU and Toradol and heating pad were given, with pain resolving to 0/10.   Plan for D/C home with PO antibiotics and close follow up in the office. Rx for Diflucan sent as pt reports yeast infections with abx.  Doxycyline 100 mg BID x 14 days. Return to MAU as needed for emergencies.     ASSESSMENT 1. Postoperative infection, unspecified type, initial encounter   2. Endometritis     PLAN Discharge home Allergies as of 06/23/2023       Reactions   Clindamycin Rash, Shortness Of Breath   Other reaction(s): Other (See Comments) Elevated BP   Clindamycin/lincomycin Shortness Of Breath, Rash, Other (See Comments)   Elevated BP   Penicillins Anaphylaxis, Hives   Sulfa Antibiotics Rash   Clotrimazole    "cracks my skin open"   Latex Hives   Monistat [miconazole Nitrate-wipes] Other (See Comments)   'cracks my skin open'   Neosporin [neomycin-bacitracin Zn-polymyx] Other (See Comments)   'cracks my skin open'   Oxybutynin    Other reaction(s): Cramps (ALLERGY/intolerance) Pt had severe abdominal cramps   Betadine [povidone Iodine] Hives, Rash   Povidone-iodine Rash, Hives        Medication List     TAKE these medications    acetaminophen 325 MG tablet Commonly known as: Tylenol Take 2 tablets (650 mg total) by mouth every 6 (six) hours as needed.   albuterol 108 (90 Base) MCG/ACT inhaler Commonly known as: VENTOLIN HFA Inhale 1 puff into the lungs every 6 (six) hours as needed for wheezing or shortness of breath.   budesonide-formoterol 80-4.5 MCG/ACT inhaler Commonly known as: SYMBICORT Inhale 1 puff into the lungs 2 (two) times daily.   cetirizine 10 MG tablet Commonly known as:  ZYRTEC Take 10 mg by mouth daily as needed for allergies.   cyclobenzaprine 10 MG tablet Commonly known as: FLEXERIL Take 1 tablet (10 mg total) by mouth 3 (three) times daily as needed for muscle spasms.   doxycycline 100 MG capsule Commonly known as: VIBRAMYCIN Take 1 capsule (100 mg total) by mouth 2 (two) times daily for 14 days.   fluconazole 150 MG tablet Commonly known as: Diflucan Take 1 tablet (150 mg total) by mouth every 3 (three) days for 2 doses. What changed: Another medication with the same name was added. Make sure you understand how and when to take each.   fluconazole 150 MG tablet Commonly known as: DIFLUCAN If you develop symptoms, take one tablet now, and one after antibiotics course is completed. What changed: You were already taking a medication with the same name, and this prescription was  added. Make sure you understand how and when to take each.   fluticasone 50 MCG/ACT nasal spray Commonly known as: FLONASE Place 2 sprays into both nostrils daily as needed for allergies.   ibuprofen 800 MG tablet Commonly known as: ADVIL Take 1 tablet (800 mg total) by mouth every 8 (eight) hours as needed for headache, moderate pain (pain score 4-6) or cramping.   montelukast 10 MG tablet Commonly known as: SINGULAIR Take 10 mg by mouth at bedtime.   ondansetron 4 MG disintegrating tablet Commonly known as: ZOFRAN-ODT Take 1 tablet (4 mg total) by mouth every 6 (six) hours as needed for nausea.   Vitafol Ultra 29-0.6-0.4-200 MG Caps Take 1 tablet by mouth daily.        Follow-up Information     Gynecology, [facility] up.   Specialty: Obstetrics and Gynecology Why: As scheduled Contact information: [address] [place] [address] [phone]                 Sharen Counter Certified Nurse-Midwife 06/23/2023  5:04 PM

## 2023-06-23 NOTE — MAU Note (Signed)
[name] is a 42 y.o. in MAU reporting: had a D&E completed yesterday by Dr Connye Burkitt at VQ25 wks, IUFD. Went home yesterday, bleeding was kind of heavy, then slacked off some. Today, started feeling chills, temp 102.7. was feeling weak and dizzy. called the dr, was instructed to come here.  Quite a bit of back pain, has been alternating Tylenol and Motrin, last taken Motrin at 1100 Bleeding is thin and light.  Onset of complaint: this morning.  Pain score: 6 Vitals:   06/23/23 1211  BP: 122/80  Pulse: 68  Resp: 18  Temp: 98.7 F (37.1 C)  SpO2: 100%      Lab orders placed from triage:

## 2023-06-24 LAB — SURGICAL PATHOLOGY

## 2023-06-27 ENCOUNTER — Encounter (HOSPITAL_BASED_OUTPATIENT_CLINIC_OR_DEPARTMENT_OTHER): Payer: Self-pay | Admitting: Obstetrics and Gynecology

## 2023-07-29 DIAGNOSIS — F4310 Post-traumatic stress disorder, unspecified: Secondary | ICD-10-CM | POA: Diagnosis not present

## 2023-08-03 DIAGNOSIS — F4310 Post-traumatic stress disorder, unspecified: Secondary | ICD-10-CM | POA: Diagnosis not present

## 2023-08-04 DIAGNOSIS — F32A Depression, unspecified: Secondary | ICD-10-CM | POA: Diagnosis not present

## 2023-09-16 DIAGNOSIS — F32A Depression, unspecified: Secondary | ICD-10-CM | POA: Diagnosis not present

## 2023-11-03 DIAGNOSIS — Z319 Encounter for procreative management, unspecified: Secondary | ICD-10-CM | POA: Diagnosis not present

## 2023-11-03 DIAGNOSIS — F32A Depression, unspecified: Secondary | ICD-10-CM | POA: Diagnosis not present

## 2023-12-11 DIAGNOSIS — Z87892 Personal history of anaphylaxis: Secondary | ICD-10-CM | POA: Diagnosis not present

## 2023-12-11 DIAGNOSIS — Z79899 Other long term (current) drug therapy: Secondary | ICD-10-CM | POA: Diagnosis not present

## 2023-12-11 DIAGNOSIS — Z87891 Personal history of nicotine dependence: Secondary | ICD-10-CM | POA: Diagnosis not present

## 2023-12-11 DIAGNOSIS — Z809 Family history of malignant neoplasm, unspecified: Secondary | ICD-10-CM | POA: Diagnosis not present

## 2023-12-11 DIAGNOSIS — F325 Major depressive disorder, single episode, in full remission: Secondary | ICD-10-CM | POA: Diagnosis not present

## 2023-12-11 DIAGNOSIS — Z833 Family history of diabetes mellitus: Secondary | ICD-10-CM | POA: Diagnosis not present

## 2023-12-11 DIAGNOSIS — G43909 Migraine, unspecified, not intractable, without status migrainosus: Secondary | ICD-10-CM | POA: Diagnosis not present

## 2023-12-11 DIAGNOSIS — Z880 Allergy status to penicillin: Secondary | ICD-10-CM | POA: Diagnosis not present

## 2023-12-11 DIAGNOSIS — E669 Obesity, unspecified: Secondary | ICD-10-CM | POA: Diagnosis not present

## 2023-12-11 DIAGNOSIS — Z87440 Personal history of urinary (tract) infections: Secondary | ICD-10-CM | POA: Diagnosis not present

## 2023-12-11 DIAGNOSIS — R32 Unspecified urinary incontinence: Secondary | ICD-10-CM | POA: Diagnosis not present

## 2023-12-11 DIAGNOSIS — Z8249 Family history of ischemic heart disease and other diseases of the circulatory system: Secondary | ICD-10-CM | POA: Diagnosis not present

## 2024-01-17 DIAGNOSIS — R519 Headache, unspecified: Secondary | ICD-10-CM | POA: Diagnosis not present

## 2024-01-17 DIAGNOSIS — R110 Nausea: Secondary | ICD-10-CM | POA: Diagnosis not present

## 2024-01-17 DIAGNOSIS — J069 Acute upper respiratory infection, unspecified: Secondary | ICD-10-CM | POA: Diagnosis not present

## 2024-02-20 ENCOUNTER — Emergency Department (HOSPITAL_BASED_OUTPATIENT_CLINIC_OR_DEPARTMENT_OTHER)
Admission: EM | Admit: 2024-02-20 | Discharge: 2024-02-21 | Disposition: A | Discharge status: Home / Self Care | Attending: Emergency Medicine | Admitting: Emergency Medicine

## 2024-02-20 DIAGNOSIS — D259 Leiomyoma of uterus, unspecified: Secondary | ICD-10-CM | POA: Diagnosis not present

## 2024-02-20 DIAGNOSIS — B9689 Other specified bacterial agents as the cause of diseases classified elsewhere: Secondary | ICD-10-CM | POA: Diagnosis not present

## 2024-02-20 DIAGNOSIS — Z91040 Latex allergy status: Secondary | ICD-10-CM | POA: Insufficient documentation

## 2024-02-20 DIAGNOSIS — N760 Acute vaginitis: Secondary | ICD-10-CM | POA: Insufficient documentation

## 2024-02-20 DIAGNOSIS — N739 Female pelvic inflammatory disease, unspecified: Secondary | ICD-10-CM | POA: Diagnosis not present

## 2024-02-20 DIAGNOSIS — N730 Acute parametritis and pelvic cellulitis: Secondary | ICD-10-CM | POA: Insufficient documentation

## 2024-02-20 DIAGNOSIS — R102 Pelvic and perineal pain: Secondary | ICD-10-CM | POA: Diagnosis not present

## 2024-02-20 DIAGNOSIS — R3129 Other microscopic hematuria: Secondary | ICD-10-CM

## 2024-02-20 LAB — CBC WITH DIFFERENTIAL/PLATELET
Abs Immature Granulocytes: 0.01 K/uL (ref 0.00–0.07)
Basophils Absolute: 0 K/uL (ref 0.0–0.1)
Basophils Relative: 0 %
Eosinophils Absolute: 0.1 K/uL (ref 0.0–0.5)
Eosinophils Relative: 1 %
HCT: 37.9 % (ref 36.0–46.0)
Hemoglobin: 12.4 g/dL (ref 12.0–15.0)
Immature Granulocytes: 0 %
Lymphocytes Relative: 36 %
Lymphs Abs: 2.3 K/uL (ref 0.7–4.0)
MCH: 30.3 pg (ref 26.0–34.0)
MCHC: 32.7 g/dL (ref 30.0–36.0)
MCV: 92.7 fL (ref 80.0–100.0)
Monocytes Absolute: 0.4 K/uL (ref 0.1–1.0)
Monocytes Relative: 7 %
Neutro Abs: 3.7 K/uL (ref 1.7–7.7)
Neutrophils Relative %: 56 %
Platelets: 221 K/uL (ref 150–400)
RBC: 4.09 MIL/uL (ref 3.87–5.11)
RDW: 12.7 % (ref 11.5–15.5)
WBC: 6.6 K/uL (ref 4.0–10.5)
nRBC: 0 % (ref 0.0–0.2)

## 2024-02-20 LAB — COMPREHENSIVE METABOLIC PANEL WITH GFR
ALT: 9 U/L (ref 0–44)
AST: 20 U/L (ref 15–41)
Albumin: 4.2 g/dL (ref 3.5–5.0)
Alkaline Phosphatase: 41 U/L (ref 38–126)
Anion gap: 10 (ref 5–15)
BUN: 12 mg/dL (ref 6–20)
CO2: 25 mmol/L (ref 22–32)
Calcium: 9.3 mg/dL (ref 8.9–10.3)
Chloride: 104 mmol/L (ref 98–111)
Creatinine, Ser: 1.01 mg/dL — ABNORMAL HIGH (ref 0.44–1.00)
GFR, Estimated: >60 mL/min (ref >60)
Glucose, Bld: 88 mg/dL (ref 70–99)
Potassium: 3.9 mmol/L (ref 3.5–5.1)
Sodium: 140 mmol/L (ref 135–145)
Total Bilirubin: 0.5 mg/dL (ref 0.0–1.2)
Total Protein: 7 g/dL (ref 6.5–8.1)

## 2024-02-20 LAB — LIPASE, BLOOD: Lipase: 25 U/L (ref 11–51)

## 2024-02-20 NOTE — ED Provider Notes (Signed)
 [place] EMERGENCY DEPARTMENT AT [facility] Provider Note   CSN: [id number] Arrival date & time: 02/20/24  2156     Patient presents with: Abdominal Pain and Flank Pain   [name] is a 43 y.o. female.  {Add pertinent medical, surgical, social history, OB history to HPI:32947}  Abdominal Pain Flank Pain Associated symptoms include abdominal pain.       Prior to Admission medications   Medication Sig Start Date End Date Taking? Authorizing Provider  acetaminophen  (TYLENOL ) 325 MG tablet Take 2 tablets (650 mg total) by mouth every 6 (six) hours as needed. 05/31/22   Cleatus Moccasin, MD  albuterol (PROVENTIL HFA;VENTOLIN HFA) 108 (90 BASE) MCG/ACT inhaler Inhale 1 puff into the lungs every 6 (six) hours as needed for wheezing or shortness of breath.    [provider]  budesonide-formoterol (SYMBICORT) 80-4.5 MCG/ACT inhaler Inhale 1 puff into the lungs 2 (two) times daily.    [provider]  cetirizine (ZYRTEC) 10 MG tablet Take 10 mg by mouth daily as needed for allergies.    [provider]  cyclobenzaprine  (FLEXERIL ) 10 MG tablet Take 1 tablet (10 mg total) by mouth 3 (three) times daily as needed for muscle spasms. 06/10/22   Cleatus Moccasin, MD  fluconazole  (DIFLUCAN ) 150 MG tablet If you develop symptoms, take one tablet now, and one after antibiotics course is completed. 06/23/23   Leftwich-Kirby, Olam LABOR, CNM  fluticasone (FLONASE) 50 MCG/ACT nasal spray Place 2 sprays into both nostrils daily as needed for allergies.    [provider]  ibuprofen  (ADVIL ) 800 MG tablet Take 1 tablet (800 mg total) by mouth every 8 (eight) hours as needed for headache, moderate pain (pain score 4-6) or cramping. 06/22/23   Davies, Melissa, DO  montelukast (SINGULAIR) 10 MG tablet Take 10 mg by mouth at bedtime.    [provider]  ondansetron  (ZOFRAN -ODT) 4 MG disintegrating tablet Take 1 tablet (4 mg total) by mouth every 6 (six)  hours as needed for nausea. 06/14/22   Cleatus Moccasin, MD  Prenat-Fe Poly-Methfol-FA-DHA (VITAFOL  ULTRA) 29-0.6-0.4-200 MG CAPS Take 1 tablet by mouth daily. 05/03/22   Constant, Peggy, MD    Allergies: Clindamycin , Clindamycin /lincomycin, Penicillins, Sulfa antibiotics, Clotrimazole, Latex, Monistat [miconazole nitrate-wipes], Neosporin [neomycin-bacitracin zn-polymyx], Oxybutynin, Betadine [povidone iodine], and Povidone-iodine    Review of Systems  Gastrointestinal:  Positive for abdominal pain.  Genitourinary:  Positive for flank pain.    Updated Vital Signs BP 124/74   Pulse 68   Temp 97.7 F (36.5 C) (Temporal)   Resp 18   Ht 5' 4 (1.626 m)   Wt 92.5 kg   LMP 02/03/2024 (Exact Date)   SpO2 100%   BMI 35.02 kg/m   Physical Exam  (all labs ordered are listed, but only abnormal results are displayed) Labs Reviewed  COMPREHENSIVE METABOLIC PANEL WITH GFR - Abnormal; Notable for the following components:      Result Value   Creatinine, Ser 1.01 (*)    All other components within normal limits  URINE CULTURE  CBC WITH DIFFERENTIAL/PLATELET  LIPASE, BLOOD  URINALYSIS, ROUTINE W REFLEX MICROSCOPIC  PREGNANCY, URINE    EKG: None  Radiology: No results found.  {Document cardiac monitor, telemetry assessment procedure when appropriate:32947} Procedures   Medications Ordered in the ED - No data to display    {Click here for ABCD2, HEART and other calculators REFRESH Note before signing:1}  Medical Decision Making  ***  {Document critical care time when appropriate  Document review of labs and clinical decision tools ie [id number], etc  Document your independent review of radiology images and any outside records  Document your discussion with family members, caretakers and with consultants  Document social determinants of health affecting pt's care  Document your decision making why or why not admission, treatments were  needed:32947:::1}   Final diagnoses:  None    ED Discharge Orders     None

## 2024-02-20 NOTE — ED Triage Notes (Signed)
 Pt states she is having left sided abdominal and flank pain.   +nausea no vomiting +dysuria

## 2024-02-21 ENCOUNTER — Emergency Department (HOSPITAL_BASED_OUTPATIENT_CLINIC_OR_DEPARTMENT_OTHER)

## 2024-02-21 ENCOUNTER — Encounter (HOSPITAL_BASED_OUTPATIENT_CLINIC_OR_DEPARTMENT_OTHER): Payer: Self-pay

## 2024-02-21 DIAGNOSIS — R1032 Left lower quadrant pain: Secondary | ICD-10-CM | POA: Diagnosis not present

## 2024-02-21 DIAGNOSIS — N3289 Other specified disorders of bladder: Secondary | ICD-10-CM | POA: Diagnosis not present

## 2024-02-21 DIAGNOSIS — R109 Unspecified abdominal pain: Secondary | ICD-10-CM | POA: Diagnosis not present

## 2024-02-21 DIAGNOSIS — D259 Leiomyoma of uterus, unspecified: Secondary | ICD-10-CM | POA: Diagnosis not present

## 2024-02-21 DIAGNOSIS — R102 Pelvic and perineal pain: Secondary | ICD-10-CM | POA: Diagnosis not present

## 2024-02-21 DIAGNOSIS — K7689 Other specified diseases of liver: Secondary | ICD-10-CM | POA: Diagnosis not present

## 2024-02-21 DIAGNOSIS — N730 Acute parametritis and pelvic cellulitis: Secondary | ICD-10-CM | POA: Diagnosis not present

## 2024-02-21 LAB — URINALYSIS, ROUTINE W REFLEX MICROSCOPIC
Bacteria, UA: NONE SEEN
Bilirubin Urine: NEGATIVE
Glucose, UA: NEGATIVE mg/dL
Ketones, ur: NEGATIVE mg/dL
Leukocytes,Ua: NEGATIVE
Nitrite: NEGATIVE
Protein, ur: NEGATIVE mg/dL
Specific Gravity, Urine: 1.026 (ref 1.005–1.030)
pH: 5.5 (ref 5.0–8.0)

## 2024-02-21 LAB — PREGNANCY, URINE: Preg Test, Ur: NEGATIVE

## 2024-02-21 LAB — RAPID HIV SCREEN (HIV 1/2 AB+AG)
HIV 1/2 Antibodies: NONREACTIVE
HIV-1 P24 Antigen - HIV24: NONREACTIVE

## 2024-02-21 LAB — WET PREP, GENITAL
Sperm: NONE SEEN
Trich, Wet Prep: NONE SEEN
WBC, Wet Prep HPF POC: >=10 — AB (ref <10)
Yeast Wet Prep HPF POC: NONE SEEN

## 2024-02-21 LAB — RPR: RPR Ser Ql: NONREACTIVE

## 2024-02-21 MED ORDER — FENTANYL CITRATE PF 50 MCG/ML IJ SOSY
50.0000 ug | PREFILLED_SYRINGE | Freq: Once | INTRAMUSCULAR | Status: AC
  Administered 2024-02-21: 50 ug via INTRAVENOUS
  Filled 2024-02-21: qty 1

## 2024-02-21 MED ORDER — DOXYCYCLINE HYCLATE 100 MG PO CAPS: 100.0000 mg | ORAL_CAPSULE | Freq: Two times a day (BID) | ORAL | 0 refills | Status: AC

## 2024-02-21 MED ORDER — ONDANSETRON HCL 4 MG/2ML IJ SOLN
4.0000 mg | Freq: Once | INTRAMUSCULAR | Status: AC
  Administered 2024-02-21: 4 mg via INTRAVENOUS
  Filled 2024-02-21: qty 2

## 2024-02-21 MED ORDER — HYDROCODONE-ACETAMINOPHEN 5-325 MG PO TABS: 2.0000 | ORAL_TABLET | Freq: Four times a day (QID) | ORAL | 0 refills | Status: AC | PRN

## 2024-02-21 MED ORDER — SODIUM CHLORIDE 0.9 % IV SOLN
100.0000 mg | Freq: Once | INTRAVENOUS | Status: AC
  Administered 2024-02-21: 100 mg via INTRAVENOUS
  Filled 2024-02-21: qty 100

## 2024-02-21 MED ORDER — METRONIDAZOLE 500 MG PO TABS
500.0000 mg | ORAL_TABLET | Freq: Two times a day (BID) | ORAL | 0 refills | Status: AC
Start: 2024-02-21 — End: 2024-03-06

## 2024-02-21 MED ORDER — LACTATED RINGERS IV BOLUS
1000.0000 mL | Freq: Once | INTRAVENOUS | Status: AC
  Administered 2024-02-21: 1000 mL via INTRAVENOUS

## 2024-02-21 MED ORDER — SODIUM CHLORIDE 0.9 % IV SOLN
1.0000 g | Freq: Once | INTRAVENOUS | Status: AC
  Administered 2024-02-21: 1 g via INTRAVENOUS
  Filled 2024-02-21: qty 10

## 2024-02-21 MED ORDER — SODIUM CHLORIDE 0.9 % IV BOLUS: 1000.0000 mL | Freq: Once | INTRAVENOUS | Status: DC

## 2024-02-21 MED ORDER — METRONIDAZOLE 500 MG/100ML IV SOLN
500.0000 mg | Freq: Once | INTRAVENOUS | Status: AC
  Administered 2024-02-21: 500 mg via INTRAVENOUS
  Filled 2024-02-21: qty 100

## 2024-02-21 MED ORDER — LACTATED RINGERS IV BOLUS: 1000.0000 mL | Freq: Once | INTRAVENOUS | Status: DC

## 2024-02-21 NOTE — Discharge Instructions (Addendum)
 Your symptoms are consistent with pelvic inflammatory disease.  You had evidence of bacterial vaginosis as well.  Will treat you with a course of Flagyl  and doxycycline .  Return for any severe worsening symptoms.  You were also found to have a uterine fibroid.  Recommend you follow-up outpatient with your OB/GYN regarding your presentation today.  Do not drink alcohol while taking Flagyl  as this can cause a bad reaction.

## 2024-02-22 DIAGNOSIS — N730 Acute parametritis and pelvic cellulitis: Secondary | ICD-10-CM | POA: Diagnosis not present

## 2024-02-22 DIAGNOSIS — D259 Leiomyoma of uterus, unspecified: Secondary | ICD-10-CM | POA: Diagnosis not present

## 2024-02-22 DIAGNOSIS — R102 Pelvic and perineal pain: Secondary | ICD-10-CM | POA: Diagnosis not present

## 2024-02-22 LAB — URINE CULTURE: Culture: <10000 — AB

## 2024-02-22 LAB — GC/CHLAMYDIA PROBE AMP (~~LOC~~) NOT AT ARMC
Chlamydia: NEGATIVE
Comment: NEGATIVE
Comment: NORMAL
Neisseria Gonorrhea: NEGATIVE

## 2024-03-01 DIAGNOSIS — N898 Other specified noninflammatory disorders of vagina: Secondary | ICD-10-CM | POA: Diagnosis not present

## 2024-03-01 DIAGNOSIS — R1013 Epigastric pain: Secondary | ICD-10-CM | POA: Diagnosis not present

## 2024-03-01 DIAGNOSIS — Z202 Contact with and (suspected) exposure to infections with a predominantly sexual mode of transmission: Secondary | ICD-10-CM | POA: Diagnosis not present

## 2024-03-01 DIAGNOSIS — N730 Acute parametritis and pelvic cellulitis: Secondary | ICD-10-CM | POA: Diagnosis not present

## 2024-03-01 DIAGNOSIS — Z6836 Body mass index (BMI) 36.0-36.9, adult: Secondary | ICD-10-CM | POA: Diagnosis not present

## 2024-03-13 ENCOUNTER — Other Ambulatory Visit: Payer: Self-pay | Admitting: Nurse Practitioner

## 2024-03-13 DIAGNOSIS — N644 Mastodynia: Secondary | ICD-10-CM

## 2024-03-13 DIAGNOSIS — Z1231 Encounter for screening mammogram for malignant neoplasm of breast: Secondary | ICD-10-CM

## 2024-03-21 ENCOUNTER — Encounter

## 2024-03-21 ENCOUNTER — Other Ambulatory Visit

## 2024-03-29 ENCOUNTER — Ambulatory Visit
Admission: RE | Admit: 2024-03-29 | Discharge: 2024-03-29 | Disposition: A | Discharge status: Home / Self Care | Source: Ambulatory Visit | Attending: Nurse Practitioner

## 2024-03-29 ENCOUNTER — Ambulatory Visit
Admission: RE | Admit: 2024-03-29 | Discharge: 2024-03-29 | Disposition: A | Discharge status: Home / Self Care | Source: Ambulatory Visit | Attending: Nurse Practitioner | Admitting: Nurse Practitioner

## 2024-03-29 ENCOUNTER — Other Ambulatory Visit: Payer: Self-pay | Admitting: Nurse Practitioner

## 2024-03-29 DIAGNOSIS — N644 Mastodynia: Secondary | ICD-10-CM

## 2024-03-29 DIAGNOSIS — M79602 Pain in left arm: Secondary | ICD-10-CM | POA: Diagnosis not present

## 2024-03-29 DIAGNOSIS — R928 Other abnormal and inconclusive findings on diagnostic imaging of breast: Secondary | ICD-10-CM | POA: Diagnosis not present

## 2024-03-30 DIAGNOSIS — Z23 Encounter for immunization: Secondary | ICD-10-CM | POA: Diagnosis not present

## 2024-03-30 DIAGNOSIS — Z0000 Encounter for general adult medical examination without abnormal findings: Secondary | ICD-10-CM | POA: Diagnosis not present

## 2024-03-30 DIAGNOSIS — E7800 Pure hypercholesterolemia, unspecified: Secondary | ICD-10-CM | POA: Diagnosis not present

## 2024-03-30 DIAGNOSIS — J45998 Other asthma: Secondary | ICD-10-CM | POA: Diagnosis not present

## 2024-03-30 DIAGNOSIS — K582 Mixed irritable bowel syndrome: Secondary | ICD-10-CM | POA: Diagnosis not present

## 2024-04-11 DIAGNOSIS — N898 Other specified noninflammatory disorders of vagina: Secondary | ICD-10-CM | POA: Diagnosis not present

## 2024-04-11 DIAGNOSIS — Z113 Encounter for screening for infections with a predominantly sexual mode of transmission: Secondary | ICD-10-CM | POA: Diagnosis not present

## 2024-04-11 DIAGNOSIS — Z124 Encounter for screening for malignant neoplasm of cervix: Secondary | ICD-10-CM | POA: Diagnosis not present

## 2024-04-11 DIAGNOSIS — Z1151 Encounter for screening for human papillomavirus (HPV): Secondary | ICD-10-CM | POA: Diagnosis not present

## 2024-04-11 DIAGNOSIS — Z01419 Encounter for gynecological examination (general) (routine) without abnormal findings: Secondary | ICD-10-CM | POA: Diagnosis not present

## 2024-04-23 ENCOUNTER — Other Ambulatory Visit: Payer: Self-pay | Admitting: Nurse Practitioner

## 2024-04-23 DIAGNOSIS — R109 Unspecified abdominal pain: Secondary | ICD-10-CM

## 2024-04-23 DIAGNOSIS — K219 Gastro-esophageal reflux disease without esophagitis: Secondary | ICD-10-CM | POA: Diagnosis not present

## 2024-04-23 DIAGNOSIS — K580 Irritable bowel syndrome with diarrhea: Secondary | ICD-10-CM | POA: Diagnosis not present

## 2024-04-26 ENCOUNTER — Ambulatory Visit
Admission: RE | Admit: 2024-04-26 | Discharge: 2024-04-26 | Disposition: A | Discharge status: Home / Self Care | Source: Ambulatory Visit | Attending: Nurse Practitioner | Admitting: Nurse Practitioner

## 2024-04-26 DIAGNOSIS — R198 Other specified symptoms and signs involving the digestive system and abdomen: Secondary | ICD-10-CM | POA: Diagnosis not present

## 2024-04-26 DIAGNOSIS — R109 Unspecified abdominal pain: Secondary | ICD-10-CM

## 2024-05-15 DIAGNOSIS — Z3202 Encounter for pregnancy test, result negative: Secondary | ICD-10-CM | POA: Diagnosis not present

## 2024-05-15 DIAGNOSIS — R87810 Cervical high risk human papillomavirus (HPV) DNA test positive: Secondary | ICD-10-CM | POA: Diagnosis not present

## 2024-05-15 DIAGNOSIS — N72 Inflammatory disease of cervix uteri: Secondary | ICD-10-CM | POA: Diagnosis not present

## 2024-05-15 DIAGNOSIS — R87610 Atypical squamous cells of undetermined significance on cytologic smear of cervix (ASC-US): Secondary | ICD-10-CM | POA: Diagnosis not present

## 2024-06-26 DIAGNOSIS — R051 Acute cough: Secondary | ICD-10-CM | POA: Diagnosis not present

## 2024-06-26 DIAGNOSIS — R5383 Other fatigue: Secondary | ICD-10-CM | POA: Diagnosis not present

## 2024-06-26 DIAGNOSIS — R52 Pain, unspecified: Secondary | ICD-10-CM | POA: Diagnosis not present

## 2024-06-28 ENCOUNTER — Encounter (HOSPITAL_BASED_OUTPATIENT_CLINIC_OR_DEPARTMENT_OTHER): Payer: Self-pay | Admitting: Emergency Medicine

## 2024-06-28 ENCOUNTER — Emergency Department (HOSPITAL_BASED_OUTPATIENT_CLINIC_OR_DEPARTMENT_OTHER)
Admission: EM | Admit: 2024-06-28 | Discharge: 2024-06-28 | Discharge status: Against Medical Advice | Attending: Emergency Medicine | Admitting: Emergency Medicine

## 2024-06-28 ENCOUNTER — Other Ambulatory Visit: Payer: Self-pay

## 2024-06-28 DIAGNOSIS — Z5321 Procedure and treatment not carried out due to patient leaving prior to being seen by health care provider: Secondary | ICD-10-CM | POA: Diagnosis not present

## 2024-06-28 DIAGNOSIS — M545 Low back pain, unspecified: Secondary | ICD-10-CM | POA: Insufficient documentation

## 2024-06-28 NOTE — ED Notes (Signed)
 Informed by registration that by left

## 2024-06-28 NOTE — ED Triage Notes (Signed)
 C/o lower back pain that rads down into left leg starting today. Denies injury to area. Denies urinary issues.

## 2024-07-04 DIAGNOSIS — M5432 Sciatica, left side: Secondary | ICD-10-CM | POA: Diagnosis not present

## 2024-07-04 DIAGNOSIS — M5459 Other low back pain: Secondary | ICD-10-CM | POA: Diagnosis not present

## 2024-07-10 ENCOUNTER — Other Ambulatory Visit: Payer: Self-pay | Admitting: Surgery

## 2024-07-10 DIAGNOSIS — M5416 Radiculopathy, lumbar region: Secondary | ICD-10-CM

## 2024-07-11 ENCOUNTER — Encounter: Payer: Self-pay | Admitting: Surgery

## 2024-07-12 ENCOUNTER — Encounter: Payer: Self-pay | Admitting: Surgery

## 2024-07-24 ENCOUNTER — Other Ambulatory Visit

## 2024-07-24 ENCOUNTER — Ambulatory Visit
Admission: RE | Admit: 2024-07-24 | Discharge: 2024-07-24 | Disposition: A | Discharge status: Home / Self Care | Source: Ambulatory Visit | Attending: Surgery | Admitting: Surgery

## 2024-07-24 DIAGNOSIS — M5416 Radiculopathy, lumbar region: Secondary | ICD-10-CM

## 2024-07-31 NOTE — Therapy (Signed)
 " OUTPATIENT PHYSICAL THERAPY THORACOLUMBAR EVALUATION   Patient Name: [name] MRN: [id number] DOB:02-13-1981, 44 y.o., female Today's Date: 08/01/2024  END OF SESSION:  PT End of Session - 08/01/24 0813     Visit Number 1    Number of Visits 1    Date for Recertification  09/26/24    Authorization Type BCBS COMM PPO; [place] MEDICAID HEALTHY BLUE    PT Start Time 0800    PT Stop Time 0845    PT Time Calculation (min) 45 min    Activity Tolerance Patient tolerated treatment well    Behavior During Therapy WFL for tasks assessed/performed          Past Medical History:  Diagnosis Date   Anxiety    Asthma    Depression    Family history of adverse reaction to anesthesia    oldest son hyper with anesthesia at age 55   GERD (gastroesophageal reflux disease)    Headache    migraine   Panic attack    Pelvic pain    PTSD (post-traumatic stress disorder)    Past Surgical History:  Procedure Laterality Date   ADENOIDECTOMY     BREAST BIOPSY Right 04/21/2023   US  RT BREAST BX W LOC DEV 1ST LESION IMG BX SPEC US  GUIDE 04/21/2023 [facility] MAMMOGRAPHY   CESAREAN SECTION  01/29/1998   DILATION AND CURETTAGE OF UTERUS     DILATION AND EVACUATION N/A 05/31/2022   Procedure: DILATATION AND EVACUATION;  Surgeon: Cleatus Moccasin, MD;  Location: [facility];  Service: Gynecology;  Laterality: N/A;   DILATION AND EVACUATION N/A 06/22/2023   Procedure: DILATATION AND EVACUATION;  Surgeon: Storm Setter, DO;  Location: [facility];  Service: Gynecology;  Laterality: N/A;   lap removal of adhesions  2008   removal of endometriosis   LAPAROSCOPY N/A 06/28/2019   Procedure: LAPAROSCOPY DIAGNOSTIC LYSIS OF ADHESIONS;  Surgeon: Leva Rush, MD;  Location: [facility];  Service: Gynecology;  Laterality: N/A;   lymph node disection     results benign   MYRINGOTOMY     OPERATIVE ULTRASOUND N/A 06/22/2023   Procedure: OPERATIVE ULTRASOUND;  Surgeon: Storm Setter,  DO;  Location: [facility];  Service: Gynecology;  Laterality: N/A;   reduction of turbinates     TONSILLECTOMY     WISDOM TOOTH EXTRACTION  02/2020   Patient Active Problem List   Diagnosis Date Noted   Missed abortion 05/31/2022   POLYCYSTIC OVARY 09/22/2006   OBESITY, NOS 09/22/2006   DEPRESSION, MAJOR, RECURRENT 09/22/2006   RHINITIS, ALLERGIC 09/22/2006   TMJ SYNDROME 09/22/2006   IRRITABLE BOWEL SYNDROME 09/22/2006    PCP: No PCP  REFERRING PROVIDER: Johnanna Camie Mates, PA-C   REFERRING DIAG: M54.16 (ICD-10-CM) - Radiculopathy, lumbar region   Rationale for Evaluation and Treatment: Rehabilitation  THERAPY DIAG:  Other low back pain  Muscle weakness (generalized)  Abnormal posture  Other disturbances of skin sensation  ONSET DATE: Chronic since 2004  SUBJECTIVE:  SUBJECTIVE STATEMENT: Pt reports a chronic Hx of low back pain (10/10) with N/T of her L toes. She experienced a recent episode of low back pain around 06/27/24 after having a strong cough and developing excruciating pain in her low back. Her L leg also felt like a noodle. Pt received a low back injection, a steroid dose pack, and gabapentin which reduced her back pain by approx 80%. She notes her L leg will still feel heavy at the end of the day. 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 Pt states she has been told she has spondylolisthesis.  PERTINENT HISTORY:  PTSD, anxiety, depression, high BMI  PAIN:  Are you having pain? Yes: NPRS scale: current 3/10. 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 No  LIVING ENVIRONMENT: Lives with: lives with their family Lives in: House/apartment Stairs: No  OCCUPATION: Home health aide  PLOF: Independent  PATIENT GOALS: To function with intermittent and less overall pain  NEXT MD VISIT: None scheduled in Epic  OBJECTIVE:  Note: Objective measures were completed at Evaluation unless otherwise noted.  DIAGNOSTIC FINDINGS:   MRI completed. Awaiting results  PATIENT SURVEYS:  Modified Oswestry: 17/50=34%  MODIFIED OSWESTRY DISABILITY SCALE  Date: 34% Score  etation of scores: Score Category Description  0-20% Minimal Disability The patient can cope with most living activities. Usually no treatment is indicated apart from advice on lifting, sitting and exercise  21-40% Moderate Disability The patient experiences more pain and difficulty with sitting, lifting and standing. Travel and social life are more difficult and they may be disabled from work. Personal care, sexual activity and sleeping are not grossly affected, and the patient can usually be managed by conservative means  41-60% Severe Disability Pain remains the main problem in this group, but activities of daily living are affected. These patients require a detailed investigation  61-80% Crippled Back pain impinges on all aspects of the patients life. Positive intervention is required  81-100% Bed-bound These patients are either bed-bound or exaggerating their symptoms  Bluford FORBES Zoe DELENA Karon DELENA, et al. Surgery versus conservative management of stable thoracolumbar fracture: the PRESTO feasibility RCT. [place] ([place]): [employer]; 2021 Nov. [facility] Technology Assessment, No. 25.62.) Appendix 3, Oswestry Disability Index category descriptors. Available from:  Findjewelers.cz  Minimally Clinically Important Difference (MCID) = 12.8%  COGNITION: Overall cognitive status: Within functional limits for tasks assessed     SENSATION: Light touch: Impaired   MUSCLE LENGTH: Hamstrings: Right WNLs deg; Left WNLs deg Debby test: Right WNLs deg; Left WNLs deg  POSTURE: rounded shoulders, forward head, and increased lumbar lordosis  PALPATION: TTP to the L paraspinals  LUMBAR ROM:   AROM eval  Flexion Full; tightness of low back and legs, walks hands up legs to return  Extension Full; no pain  Right lateral flexion Full; increase in L LBP  Left lateral flexion Full; increase L LBP  Right rotation Full; no pain  Left rotation Full; no pain   (Blank rows = not tested)  LOWER EXTREMITY ROM:     WFLs Active  Right eval Left eval  Hip flexion    Hip extension    Hip abduction    Hip adduction    Hip internal rotation    Hip external rotation    Knee flexion    Knee extension    Ankle dorsiflexion    Ankle plantarflexion    Ankle inversion    Ankle eversion     (Blank rows = not tested)  LOWER EXTREMITY MMT:   Decreased core strength MMT Right eval Left eval  Hip flexion 4+ 3  Hip extension 4 3  Hip abduction 4 3+  Hip adduction    Hip internal rotation    Hip external rotation 4 3+  Knee flexion    Knee extension    Ankle dorsiflexion    Ankle plantarflexion    Ankle inversion    Ankle eversion     (Blank rows = not tested)  LUMBAR SPECIAL TESTS:  Straight leg raise test: Negative and Slump test: Negative  FUNCTIONAL TESTS:  5 times sit to stand: TBA  GAIT: Distance walked: 200' Assistive device utilized: None Level of assistance: Complete Independence Comments: WNLs  TREATMENT DATE:  [facility] Adult PT Treatment:                                                DATE: 08/01/24 Therapeutic Exercise: Developed, instructed in, and pt completed therex as noted in HEP  Self Care: Eval  findings and purpose of PT                                                                                                                                PATIENT EDUCATION:  Education details: Eval findings, POC, HEP, self care Person educated: Patient Education method: Explanation, Demonstration, Tactile cues, Verbal cues, and Handouts Education comprehension: verbalized understanding, returned demonstration, verbal cues required, and tactile cues required  HOME EXERCISE PROGRAM: Access Code: [email or web address] URL: [email or web address] Date: 08/01/2024 Prepared by: Dasie Daft  Exercises - Supine Posterior Pelvic Tilt  - 1-2 x daily - 7 x weekly - 1-2 sets - 10 reps - 3 hold - Supine Bridge  - 1-2 x daily - 7 x weekly - 1-2 sets - 10 reps - 5 hold - Hooklying Clamshell with Resistance  - 1-2 x daily - 7 x weekly - 1-2 sets - 10 reps - 3 hold  ASSESSMENT:  CLINICAL IMPRESSION: Patient is a 44 y.o. female who was seen today for physical therapy evaluation and treatment for M54.16 (ICD-10-CM) - Radiculopathy, lumbar region. Pt presents to PT with acute on chronic LBP and N/T of the L toes. With a low back injection, a steroid dose pack, and gabapentin pt reports good improvement in this pain. Pt reports a Hx of spondylolisthesis. A HEP was initiated for lumbopelvic flexibility c a flexion bias and for low  back strengthening and stability. Pt will benefit from skilled PT 1w8 to address impairments to optimize back function with less pain.     OBJECTIVE IMPAIRMENTS: decreased activity tolerance, decreased strength, postural dysfunction, pain, and high BMI.   ACTIVITY LIMITATIONS: carrying, lifting, bending, standing, squatting, locomotion level, and caring for others  PARTICIPATION LIMITATIONS: meal prep, cleaning, laundry, and occupation  PERSONAL FACTORS: Fitness, Past/current experiences, Time since onset of injury/illness/exacerbation, and 3+ comorbidities:   PTSD, anxiety,  depression, high BMI are also affecting patient's functional outcome.   REHAB POTENTIAL: Good  CLINICAL DECISION MAKING: Evolving/moderate complexity  EVALUATION COMPLEXITY: Moderate   GOALS:  SHORT TERM GOALS: Target date: 08/17/24  Pt will be Ind in an initial HEP  Baseline: started Goal status: INITIAL  LONG TERM GOALS: Target date: 09/26/24  Pt will be Ind in a final HEP to maintain achieved LOF Baseline: started Goal status: INITIAL  2.  Pt will report an additional 50% improvement in her LBP since the start of PT forimproved function and QOL Baseline: 3/10 Goal status: INITIAL  3.  Improve 5xSTS by MCID of 5 as indication of improved functional mobility  Baseline: TBA Goal status: INITIAL  4.  Pt's Mod Oswestry score will improve by the MCID to the predicted value of 21% as indication of improved function  Baseline: 34% Goal status: INITIAL  5.  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  PLAN FOR NEXT SESSION: Assess 5xSTS; assess response to HEP; progress therex as indicated; use of modalities, manual therapt as indicated.   Nechuma Boven MS, PT 08/01/2024 6:02 PM  For all possible CPT codes, reference the Planned Interventions line above.     Check all conditions that are expected to impact treatment: {Conditions expected to impact treatment:Musculoskeletal disorders   If treatment provided at initial evaluation, no  treatment charged due to lack of authorization.        "

## 2024-08-01 ENCOUNTER — Ambulatory Visit: Attending: Physician Assistant

## 2024-08-01 ENCOUNTER — Other Ambulatory Visit: Payer: Self-pay

## 2024-08-01 DIAGNOSIS — R208 Other disturbances of skin sensation: Secondary | ICD-10-CM | POA: Diagnosis present

## 2024-08-01 DIAGNOSIS — M6281 Muscle weakness (generalized): Secondary | ICD-10-CM | POA: Insufficient documentation

## 2024-08-01 DIAGNOSIS — M5459 Other low back pain: Secondary | ICD-10-CM | POA: Insufficient documentation

## 2024-08-01 DIAGNOSIS — R293 Abnormal posture: Secondary | ICD-10-CM | POA: Diagnosis present

## 2024-08-07 NOTE — Therapy (Signed)
 " OUTPATIENT PHYSICAL THERAPY THORACOLUMBAR TREAMENT   Patient Name: [name] MRN: [id number] DOB:1981-02-06, 44 y.o., female Today's Date: 08/09/2024  END OF SESSION:  PT End of Session - 08/09/24 0740     Visit Number 2    Number of Visits 2    Date for Recertification  09/26/24    Authorization Type BCBS COMM PPO; [place] MEDICAID HEALTHY BLUE. Approved 5 PT visits from 08/01/24-09/29/24    PT Start Time 0717    PT Stop Time 0800    PT Time Calculation (min) 43 min    Activity Tolerance Patient tolerated treatment well    Behavior During Therapy WFL for tasks assessed/performed           Past Medical History:  Diagnosis Date   Anxiety    Asthma    Depression    Family history of adverse reaction to anesthesia    oldest son hyper with anesthesia at age 27   GERD (gastroesophageal reflux disease)    Headache    migraine   Panic attack    Pelvic pain    PTSD (post-traumatic stress disorder)    Past Surgical History:  Procedure Laterality Date   ADENOIDECTOMY     BREAST BIOPSY Right 04/21/2023   US  RT BREAST BX W LOC DEV 1ST LESION IMG BX SPEC US  GUIDE 04/21/2023 [facility] MAMMOGRAPHY   CESAREAN SECTION  01/29/1998   DILATION AND CURETTAGE OF UTERUS     DILATION AND EVACUATION N/A 05/31/2022   Procedure: DILATATION AND EVACUATION;  Surgeon: Cleatus Moccasin, MD;  Location: [facility];  Service: Gynecology;  Laterality: N/A;   DILATION AND EVACUATION N/A 06/22/2023   Procedure: DILATATION AND EVACUATION;  Surgeon: Storm Setter, DO;  Location: [facility];  Service: Gynecology;  Laterality: N/A;   lap removal of adhesions  2008   removal of endometriosis   LAPAROSCOPY N/A 06/28/2019   Procedure: LAPAROSCOPY DIAGNOSTIC LYSIS OF ADHESIONS;  Surgeon: Leva Rush, MD;  Location: [facility];  Service: Gynecology;  Laterality: N/A;   lymph node disection     results benign   MYRINGOTOMY     OPERATIVE ULTRASOUND N/A 06/22/2023   Procedure:  OPERATIVE ULTRASOUND;  Surgeon: Storm Setter, DO;  Location: [facility];  Service: Gynecology;  Laterality: N/A;   reduction of turbinates     TONSILLECTOMY     WISDOM TOOTH EXTRACTION  02/2020   Patient Active Problem List   Diagnosis Date Noted   Missed abortion 05/31/2022   POLYCYSTIC OVARY 09/22/2006   OBESITY, NOS 09/22/2006   DEPRESSION, MAJOR, RECURRENT 09/22/2006   RHINITIS, ALLERGIC 09/22/2006   TMJ SYNDROME 09/22/2006   IRRITABLE BOWEL SYNDROME 09/22/2006    PCP: No PCP  REFERRING PROVIDER: Johnanna Camie Mates, PA-C   REFERRING DIAG: M54.16 (ICD-10-CM) - Radiculopathy, lumbar region   Rationale for Evaluation and Treatment: Rehabilitation  THERAPY DIAG:  Other low back pain  Muscle weakness (generalized)  Abnormal posture  Other disturbances of skin sensation  ONSET DATE: Chronic since 2004  SUBJECTIVE:  SUBJECTIVE STATEMENT: Pt reports she is completing her HEP 2x a day and they provide some relief of her back pain.  EVAL: Pt reports a chronic Hx of low back pain (10/10) with N/T of her L toes. She experienced a recent episode of low back pain around 06/27/24 after having a strong cough and developing excruciating pain in her low back. Her L leg also felt like a noodle. Pt received a low back injection, a steroid dose pack, and gabapentin which reduced her back pain by approx 80%. She notes her L leg will still feel heavy at the end of the day. 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 Pt states she has been told she has spondylolisthesis.  PERTINENT HISTORY:  PTSD, anxiety, depression, high BMI  PAIN:  Are you having pain? Yes: NPRS scale: current:/10. 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 No  LIVING ENVIRONMENT: Lives with: lives with their family Lives in: House/apartment Stairs: No  OCCUPATION: Home health aide  PLOF: Independent  PATIENT GOALS: To function with intermittent and less overall pain  NEXT MD VISIT: None scheduled in Epic  OBJECTIVE:  Note: Objective measures were completed at Evaluation unless otherwise noted.  DIAGNOSTIC FINDINGS:   MRI completed. Awaiting results  PATIENT SURVEYS:  Modified Oswestry: 17/50=34%  MODIFIED OSWESTRY DISABILITY SCALE  Date: 34% Score  etation of scores: Score Category Description  0-20% Minimal Disability The patient can cope with most living activities. Usually no treatment is indicated apart from advice on lifting, sitting and exercise  21-40% Moderate Disability The patient experiences more pain and difficulty with sitting, lifting and standing. Travel and social life are more difficult and they may be disabled from work. Personal care, sexual activity and sleeping are not grossly affected, and the patient can usually be managed by conservative means  41-60% Severe Disability Pain remains the main problem in this group, but activities of daily living are affected. These patients require a detailed investigation  61-80% Crippled Back pain impinges on all aspects of the patients life. Positive intervention is required  81-100% Bed-bound These patients are either bed-bound or exaggerating their symptoms  Bluford FORBES Zoe DELENA Karon DELENA, et al. Surgery versus conservative management of stable thoracolumbar fracture: the PRESTO feasibility RCT. [place] ([place]): [employer]; 2021 Nov.  [facility] Technology Assessment, No. 25.62.) Appendix 3, Oswestry Disability Index category descriptors. Available from: Findjewelers.cz  Minimally Clinically Important Difference (MCID) = 12.8%  COGNITION: Overall cognitive status: Within functional limits for tasks assessed     SENSATION: Light touch: Impaired   MUSCLE LENGTH: Hamstrings: Right WNLs deg; Left WNLs deg Debby test: Right WNLs deg; Left WNLs deg  POSTURE: rounded shoulders, forward head, and increased lumbar lordosis  PALPATION: TTP to the L paraspinals  LUMBAR ROM:   AROM eval  Flexion Full; tightness of low back and legs, walks hands up legs to return  Extension Full; no pain  Right lateral flexion Full; increase in L LBP  Left lateral flexion Full; increase L LBP  Right rotation Full; no pain  Left rotation Full; no pain   (  Blank rows = not tested)  LOWER EXTREMITY ROM:     WFLs Active  Right eval Left eval  Hip flexion    Hip extension    Hip abduction    Hip adduction    Hip internal rotation    Hip external rotation    Knee flexion    Knee extension    Ankle dorsiflexion    Ankle plantarflexion    Ankle inversion    Ankle eversion     (Blank rows = not tested)  LOWER EXTREMITY MMT:   Decreased core strength MMT Right eval Left eval  Hip flexion 4+ 3  Hip extension 4 3  Hip abduction 4 3+  Hip adduction    Hip internal rotation    Hip external rotation 4 3+  Knee flexion    Knee extension    Ankle dorsiflexion    Ankle plantarflexion    Ankle inversion    Ankle eversion     (Blank rows = not tested)  LUMBAR SPECIAL TESTS:  Straight leg raise test: Negative and Slump test: Negative  FUNCTIONAL TESTS:  5 times sit to stand: TBA  GAIT: Distance walked: 200' Assistive device utilized: None Level of assistance: Complete Independence Comments: WNLs  TREATMENT DATE:  [facility] Adult PT Treatment:                                                DATE:  08/09/24 Therapeutic Exercise: Seated Pball trunk roll outs SKTC Thomas stretch  Supine Posterior Pelvic Tilt x10 3 90/90 AB bracing Supine Bridge x10 3 Hooklying Clamshell BluTB x10 3 Plantargrade hip ext x15 HEP updated  [facility] Adult PT Treatment:                                                DATE: 08/01/24 Therapeutic Exercise: Developed, instructed in, and pt completed therex as noted in HEP  Self Care: Eval findings and purpose of PT                                                                                                                                PATIENT EDUCATION:  Education details: Eval findings, POC, HEP, self care Person educated: Patient Education method: Explanation, Demonstration, Tactile cues, Verbal cues, and Handouts Education comprehension: verbalized understanding, returned demonstration, verbal cues required, and tactile cues required  HOME EXERCISE PROGRAM: Access Code: [email or web address] URL: [email or web address] Date: 08/09/2024 Prepared by: Dasie Daft  Exercises - Seated Flexion Stretch with Swiss Ball  - 1-2 x daily - 7 x weekly - 1 sets - 10 reps - 5-30 hold - Hooklying Single Knee to Chest Stretch  - 1-2 x daily -  7 x weekly - 1 sets - 2-3 reps - 20 hold - Hip Flexor Stretch at Edge of Bed  - 1-2 x daily - 7 x weekly - 1 sets - 2-3 reps - 30 hold - Supine Posterior Pelvic Tilt  - 1-2 x daily - 7 x weekly - 1-2 sets - 10 reps - 3 hold - Supine 90/90 Abdominal Bracing  - 1-2 x daily - 7 x weekly - 1-2 sets - 5 reps - 10 hold - Supine Bridge  - 1-2 x daily - 7 x weekly - 1-2 sets - 10 reps - 5 hold - Hooklying Clamshell with Resistance  - 1-2 x daily - 7 x weekly - 1-2 sets - 10 reps - 3 hold - Bird Dog on Counter  - 1 x daily - 7 x weekly - 3 sets - 15 reps - 3 hold  ASSESSMENT:  CLINICAL IMPRESSION: PT was completed for lumboplevic flexibility and strengthening. Verbal and tactile cueing was provided for most proper technique. Pt is  reporting consistent completion of her HEP with it being beneficial with lowering her low back pain. Pt's HEP was updated. Pt will continue to benefit from skilled PT to address impairments for improved back function with less pain.  EVAL: Patient is a 44 y.o. female who was seen today for physical therapy evaluation and treatment for M54.16 (ICD-10-CM) - Radiculopathy, lumbar region. Pt presents to PT with acute on chronic LBP and N/T of the L toes. With a low back injection, a steroid dose pack, and gabapentin pt reports good improvement in this pain. Pt reports a Hx of spondylolisthesis. A HEP was initiated for lumbopelvic flexibility c a flexion bias and for low back strengthening and stability. Pt will benefit from skilled PT 1w8 to address impairments to optimize back function with less pain.     OBJECTIVE IMPAIRMENTS: decreased activity tolerance, decreased strength, postural dysfunction, pain, and high BMI.   ACTIVITY LIMITATIONS: carrying, lifting, bending, standing, squatting, locomotion level, and caring for others  PARTICIPATION LIMITATIONS: meal prep, cleaning, laundry, and occupation  PERSONAL FACTORS: Fitness, Past/current experiences, Time since onset of injury/illness/exacerbation, and 3+ comorbidities:   PTSD, anxiety, depression, high BMI are also affecting patient's functional outcome.   REHAB POTENTIAL: Good  CLINICAL DECISION MAKING: Evolving/moderate complexity  EVALUATION COMPLEXITY: Moderate   GOALS:  SHORT TERM GOALS: Target date: 08/17/24  Pt will be Ind in an initial HEP  Baseline: started Goal status: INITIAL  LONG TERM GOALS: Target date: 09/26/24  Pt will be Ind in a final HEP to maintain achieved LOF Baseline: started Goal status: INITIAL  2.  Pt will report an additional 50% improvement in her LBP since the start of PT forimproved function and QOL Baseline: 3/10 Goal status: INITIAL  3.  Improve 5xSTS by MCID of 5 as indication of improved  functional mobility  Baseline: TBA Goal status: INITIAL  4.  Pt's Mod Oswestry score will improve by the MCID to the predicted value of 21% as indication of improved function  Baseline: 34% Goal status: INITIAL  5.  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  PLAN FOR NEXT SESSION: Assess 5xSTS; assess response to HEP; progress therex as indicated; use of modalities, manual therapt as indicated.   Audrey Thull MS, PT 08/09/24 9:56 AM      "

## 2024-08-09 ENCOUNTER — Ambulatory Visit

## 2024-08-09 DIAGNOSIS — R208 Other disturbances of skin sensation: Secondary | ICD-10-CM

## 2024-08-09 DIAGNOSIS — R293 Abnormal posture: Secondary | ICD-10-CM

## 2024-08-09 DIAGNOSIS — M5459 Other low back pain: Secondary | ICD-10-CM

## 2024-08-09 DIAGNOSIS — M6281 Muscle weakness (generalized): Secondary | ICD-10-CM

## 2024-08-15 ENCOUNTER — Ambulatory Visit

## 2024-08-15 DIAGNOSIS — M6281 Muscle weakness (generalized): Secondary | ICD-10-CM

## 2024-08-15 DIAGNOSIS — R293 Abnormal posture: Secondary | ICD-10-CM

## 2024-08-15 DIAGNOSIS — R208 Other disturbances of skin sensation: Secondary | ICD-10-CM

## 2024-08-15 DIAGNOSIS — M5459 Other low back pain: Secondary | ICD-10-CM

## 2024-08-15 NOTE — Therapy (Signed)
 " OUTPATIENT PHYSICAL THERAPY THORACOLUMBAR TREAMENT   Patient Name: [name] MRN: [id number] DOB:[birth year], 44 y.o., female Today's Date: 08/15/2024  END OF SESSION:  PT End of Session - 08/15/24 0802     Visit Number 3    Number of Visits 3    Date for Recertification  09/26/24    Authorization Type BCBS COMM PPO; [place] MEDICAID HEALTHY BLUE. Approved 5 PT visits from 08/01/24-09/29/24    PT Start Time 0728    PT Stop Time 0800    PT Time Calculation (min) 32 min    Activity Tolerance Patient tolerated treatment well    Behavior During Therapy WFL for tasks assessed/performed            Past Medical History:  Diagnosis Date   Anxiety    Asthma    Depression    Family history of adverse reaction to anesthesia    oldest son hyper with anesthesia at age 36   GERD (gastroesophageal reflux disease)    Headache    migraine   Panic attack    Pelvic pain    PTSD (post-traumatic stress disorder)    Past Surgical History:  Procedure Laterality Date   ADENOIDECTOMY     BREAST BIOPSY Right 04/21/2023   US  RT BREAST BX W LOC DEV 1ST LESION IMG BX SPEC US  GUIDE 04/21/2023 [facility]MOGRAP[facility]   CESAREAN SECTION  01/29/1998   DILATION AND CURETTAGE OF UTERUS     DILATION AND EVACUATION N/A 05/31/2022   Procedure: DILATATION AND EVACUATION;  Surgeon: Cleatus Moccasin, MD;  Location: [facility];  Service: Gynecology;  Laterality: N/A;   DILATION AND EVACUATION N/A 06/22/2023   Procedure: DILATATION AND EVACUATION;  Surgeon: Storm Setter, DO;  Location: [facility];  Service: Gynecology;  Laterality: N/A;   lap removal of adhesions  2008   removal of endometriosis   LAPAROSCOPY N/A 06/28/2019   Procedure: LAPAROSCOPY DIAGNOSTIC LYSIS OF ADHESIONS;  Surgeon: Leva Rush, MD;  Location: [facility];  Service: Gynecology;  Laterality: N/A;   lymph node disection     results benign   MYRINGOTOMY     OPERATIVE ULTRASOUND N/A 06/22/2023   Procedure:  OPERATIVE ULTRASOUND;  Surgeon: Storm Setter, DO;  Location: [facility];  Service: Gynecology;  Laterality: N/A;   reduction of turbinates     TONSILLECTOMY     WISDOM TOOTH EXTRACTION  02/2020   Patient Active Problem List   Diagnosis Date Noted   Missed abortion 05/31/2022   POLYCYSTIC OVARY 09/22/2006   OBESITY, NOS 09/22/2006   DEPRESSION, MAJOR, RECURRENT 09/22/2006   RHINITIS, ALLERGIC 09/22/2006   TMJ SYNDROME 09/22/2006   IRRITABLE BOWEL SYNDROME 09/22/2006    PCP: No PCP  REFERRING PROVIDER: Johnanna Camie Mates, PA-C   REFERRING DIAG: M54.16 (ICD-10-CM) - Radiculopathy, lumbar region   Rationale for Evaluation and Treatment: Rehabilitation  THERAPY DIAG:  Other low back pain  Muscle weakness (generalized)  Abnormal posture  Other disturbances of skin sensation  ONSET DATE: Chronic since 2004  SUBJECTIVE:  SUBJECTIVE STATEMENT: Pt reports since last Thurs she has experienced anterior R hip and thigh burning. Pt states she had an appt at [facility] this past Monday to discuss her MRI.  EVAL: Pt reports a chronic Hx of low back pain (10/10) with N/T of her L toes. She experienced a recent episode of low back pain around 06/27/24 after having a strong cough and developing excruciating pain in her low back. Her L leg also felt like a noodle. Pt received a low back injection, a steroid dose pack, and gabapentin which reduced her back pain by approx 80%. She notes her L leg will still feel heavy at the end of the day. 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 Pt states she has been told she has spondylolisthesis.  PERTINENT HISTORY:  PTSD, anxiety, depression, high BMI  PAIN:  Are you having pain?  Yes: NPRS scale: current:0/10. Range in the past week: 0-6/10 Pain location: L low back Pain description: stabbing, ache, constant  Aggravating factors: end of day as a home health aide, involves Relieving factors: gabapentin, heating pad, combo of rest and measured  PRECAUTIONS: None  RED FLAGS: None   WEIGHT BEARING RESTRICTIONS: No  FALLS:  Has patient fallen in last 6 months? No  LIVING ENVIRONMENT: Lives with: lives with their family Lives in: House/apartment Stairs: No  OCCUPATION: Home health aide  PLOF: Independent  PATIENT GOALS: To function with intermittent and less overall pain  NEXT MD VISIT: None scheduled in Epic  OBJECTIVE:  Note: Objective measures were completed at Evaluation unless otherwise noted.  DIAGNOSTIC FINDINGS:   MRI completed. Awaiting results 07/24/24: IMPRESSION: 1. Small disc bulges at L3-L4 and L4-L5 without central spinal canal or neural foraminal stenosis.  PATIENT SURVEYS:  Modified Oswestry: 17/50=34%  MODIFIED OSWESTRY DISABILITY SCALE  Date: 34% Score  etation of scores: Score Category Description  0-20% Minimal Disability The patient can cope with most living activities. Usually no treatment is indicated apart from advice on lifting, sitting and exercise  21-40% Moderate Disability The patient experiences more pain and difficulty with sitting, lifting and standing. Travel and social life are more difficult and they may be disabled from work. Personal care, sexual activity and sleeping are not grossly affected, and the patient can usually be managed by conservative means  41-60% Severe Disability Pain remains the main problem in this group, but activities of daily living are affected. These patients require a detailed investigation  61-80% Crippled Back pain impinges on all aspects of the patients life. Positive intervention is required  81-100% Bed-bound These patients are either bed-bound or exaggerating their symptoms  Bluford FORBES Zoe DELENA Karon DELENA, et al. Surgery versus conservative management of stable thoracolumbar fracture: the PRESTO feasibility RCT. [place] ([place]): [employer]; 2021 Nov. [facility] Technology Assessment, No. 25.62.) Appendix 3, Oswestry Disability Index category descriptors. Available from: [email or web address].cz  Minimally Clinically Important Difference (MCID) = 12.8%  COGNITION: Overall cognitive status: Within functional limits for tasks assessed     SENSATION: Light touch: Impaired   MUSCLE LENGTH: Hamstrings: Right WNLs deg; Left WNLs deg Debby test: Right WNLs deg; Left WNLs deg  POSTURE: rounded shoulders, forward head, and increased lumbar lordosis  PALPATION: TTP to the L paraspinals  LUMBAR ROM:   AROM eval  Flexion Full; tightness of low back and legs, walks hands up legs to return  Extension Full; no pain  Right  lateral flexion Full; increase in L LBP  Left lateral flexion Full; increase L LBP  Right rotation Full; no pain  Left rotation Full; no pain   (Blank rows = not tested)  LOWER EXTREMITY ROM:     WFLs Active  Right eval Left eval  Hip flexion    Hip extension    Hip abduction    Hip adduction    Hip internal rotation    Hip external rotation    Knee flexion    Knee extension    Ankle dorsiflexion    Ankle plantarflexion    Ankle inversion    Ankle eversion     (Blank rows = not tested)  LOWER EXTREMITY MMT:   Decreased core strength MMT Right eval Left eval  Hip flexion 4+ 3  Hip extension 4 3  Hip abduction 4 3+  Hip adduction    Hip internal rotation    Hip external rotation 4 3+  Knee flexion    Knee extension    Ankle dorsiflexion    Ankle plantarflexion    Ankle inversion    Ankle eversion     (Blank rows = not tested)  LUMBAR SPECIAL TESTS:  Straight leg raise test: Negative and Slump test: Negative  FUNCTIONAL TESTS:  5 times sit to stand: TBA  GAIT: Distance walked:  200' Assistive device utilized: None Level of assistance: Complete Independence Comments: WNLs  TREATMENT DATE:  [facility] Adult PT Treatment:                                                DATE: 08/15/24 Therapeutic Exercise/Neuromuscular Re-Ed: NuStep 5 mins L4 UE/LE SKTC Supine Sciatic Nerve Glide x10 1 Supine Posterior Pelvic Tilt x10 3 90/90 AB bracing Supine Bridge x10 3 Bird Dog on Counter x10 each Prone Hip Extension  x10 each Prone Knee Flexion Extension AROM x10 each HEP updated  [facility] Adult PT Treatment:                                                DATE: 08/09/24 Therapeutic Exercise: Seated Pball trunk roll outs SKTC Thomas stretch  Supine Posterior Pelvic Tilt x10 3 90/90 AB bracing Supine Bridge x10 3 Hooklying Clamshell BluTB x10 3 Plantargrade hip ext x15 HEP updated                                                                                                                                PATIENT EDUCATION:  Education details: Eval findings, POC, HEP, self care Person educated: Patient Education method: Explanation, Demonstration, Tactile cues, Verbal cues, and Handouts Education comprehension: verbalized understanding, returned demonstration, verbal cues required, and tactile cues  required  HOME EXERCISE PROGRAM: Access Code: [email or web address] URL: [email or web address] Date: 08/15/2024 Prepared by: Dasie Daft  Exercises - Hooklying Single Knee to Chest Stretch  - 1-2 x daily - 7 x weekly - 1 sets - 2-3 reps - 20 hold - Supine Sciatic Nerve Glide  - 1 x daily - 7 x weekly - 1 sets - 10 reps - 1 hold - Supine Posterior Pelvic Tilt  - 1-2 x daily - 7 x weekly - 1-2 sets - 10 reps - 3 hold - Supine 90/90 Abdominal Bracing  - 1-2 x daily - 7 x weekly - 1-2 sets - 5 reps - 10 hold - Supine Bridge  - 1-2 x daily - 7 x weekly - 1-2 sets - 10 reps - 5 hold - Hooklying Clamshell with Resistance  - 1-2 x daily - 7 x weekly - 1-2 sets - 10 reps - 3  hold - Bird Dog on Counter  - 1 x daily - 7 x weekly - 1-2 sets - 15 reps - 3 hold - Prone Hip Extension  - 1 x daily - 7 x weekly - 1-2 sets - 10 reps - 3 hold - Prone Knee Flexion Extension AROM  - 1 x daily - 7 x weekly - 1-2 sets - 10 reps - 3 hold  ASSESSMENT:  CLINICAL IMPRESSION: Treatment time was limited due to pt arriving late. Results of MRI: Small disc bulges at L3-L4 and L4-L5 without central spinal canal or neural foraminal stenosis. PT was completed for lumboplevic flexibility and strengthening. Additional exs for back strengthening and for sciatic neural glides were added. Verbal and tactile cueing was provided for most proper technique. Pt's HEP was updated. Pt tolerated PT today without adverse effects. Pt will continue to benefit from skilled PT to address impairments for improved back function with less pain.  EVAL: Patient is a 44 y.o. female who was seen today for physical therapy evaluation and treatment for M54.16 (ICD-10-CM) - Radiculopathy, lumbar region. Pt presents to PT with acute on chronic LBP and N/T of the L toes. With a low back injection, a steroid dose pack, and gabapentin pt reports good improvement in this pain. Pt reports a Hx of spondylolisthesis. A HEP was initiated for lumbopelvic flexibility c a flexion bias and for low back strengthening and stability. Pt will benefit from skilled PT 1w8 to address impairments to optimize back function with less pain.     OBJECTIVE IMPAIRMENTS: decreased activity tolerance, decreased strength, postural dysfunction, pain, and high BMI.   ACTIVITY LIMITATIONS: carrying, lifting, bending, standing, squatting, locomotion level, and caring for others  PARTICIPATION LIMITATIONS: meal prep, cleaning, laundry, and occupation  PERSONAL FACTORS: Fitness, Past/current experiences, Time since onset of injury/illness/exacerbation, and 3+ comorbidities:   PTSD, anxiety, depression, high BMI are also affecting patient's functional  outcome.   REHAB POTENTIAL: Good  CLINICAL DECISION MAKING: Evolving/moderate complexity  EVALUATION COMPLEXITY: Moderate   GOALS:  SHORT TERM GOALS: Target date: 08/17/24  Pt will be Ind in an initial HEP  Baseline: started Goal status: INITIAL  LONG TERM GOALS: Target date: 09/26/24  Pt will be Ind in a final HEP to maintain achieved LOF Baseline: started Goal status: INITIAL  2.  Pt will report an additional 50% improvement in her LBP since the start of PT forimproved function and QOL Baseline: 3/10 Goal status: INITIAL  3.  Improve 5xSTS by MCID of 5 as indication of improved functional mobility  Baseline: TBA Goal status: INITIAL  4.  Pt's Mod Oswestry score will improve by the MCID to the predicted value of 21% as indication of improved function  Baseline: 34% Goal status: INITIAL  5.  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  PLAN FOR NEXT SESSION: Assess 5xSTS; assess response to HEP; progress therex as indicated; use of modalities, manual therapt as indicated.   Cataleah Stites MS, PT 08/15/24 9:55 AM      "

## 2024-08-22 ENCOUNTER — Ambulatory Visit: Admitting: Physical Therapy

## 2024-08-29 ENCOUNTER — Ambulatory Visit

## 2024-09-04 ENCOUNTER — Ambulatory Visit
